# Patient Record
Sex: Male | Born: 1985 | Race: White | Hispanic: No | Marital: Single | Smoking: Current every day smoker
Health system: Southern US, Community
[De-identification: ages and names within clinical notes are randomized; demographics above are authoritative.]

## PROBLEM LIST (undated history)

## (undated) DIAGNOSIS — R011 Cardiac murmur, unspecified: Secondary | ICD-10-CM

## (undated) DIAGNOSIS — IMO0002 Reserved for concepts with insufficient information to code with codable children: Secondary | ICD-10-CM

## (undated) DIAGNOSIS — I341 Nonrheumatic mitral (valve) prolapse: Secondary | ICD-10-CM

## (undated) HISTORY — PX: DENTAL SURGERY: SHX609

## (undated) HISTORY — PX: CHOLECYSTECTOMY: SHX55

---
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
## 2008-12-30 ENCOUNTER — Emergency Department (HOSPITAL_BASED_OUTPATIENT_CLINIC_OR_DEPARTMENT_OTHER): Admission: EM | Admit: 2008-12-30 | Discharge: 2008-12-30 | Payer: Self-pay | Admitting: Emergency Medicine

## 2008-12-30 ENCOUNTER — Ambulatory Visit: Payer: Self-pay | Admitting: Diagnostic Radiology

## 2009-01-07 ENCOUNTER — Ambulatory Visit: Payer: Self-pay | Admitting: Diagnostic Radiology

## 2009-01-07 ENCOUNTER — Emergency Department (HOSPITAL_BASED_OUTPATIENT_CLINIC_OR_DEPARTMENT_OTHER): Admission: EM | Admit: 2009-01-07 | Discharge: 2009-01-07 | Payer: Self-pay | Admitting: Emergency Medicine

## 2009-03-09 ENCOUNTER — Emergency Department (HOSPITAL_BASED_OUTPATIENT_CLINIC_OR_DEPARTMENT_OTHER): Admission: EM | Admit: 2009-03-09 | Discharge: 2009-03-09 | Payer: Self-pay | Admitting: Emergency Medicine

## 2009-04-11 ENCOUNTER — Emergency Department (HOSPITAL_BASED_OUTPATIENT_CLINIC_OR_DEPARTMENT_OTHER): Admission: EM | Admit: 2009-04-11 | Discharge: 2009-04-11 | Payer: Self-pay | Admitting: Emergency Medicine

## 2009-04-28 ENCOUNTER — Emergency Department (HOSPITAL_BASED_OUTPATIENT_CLINIC_OR_DEPARTMENT_OTHER): Admission: EM | Admit: 2009-04-28 | Discharge: 2009-04-28 | Payer: Self-pay | Admitting: Emergency Medicine

## 2009-04-28 ENCOUNTER — Ambulatory Visit: Payer: Self-pay | Admitting: Diagnostic Radiology

## 2009-04-30 ENCOUNTER — Emergency Department (HOSPITAL_BASED_OUTPATIENT_CLINIC_OR_DEPARTMENT_OTHER): Admission: EM | Admit: 2009-04-30 | Discharge: 2009-04-30 | Payer: Self-pay | Admitting: Emergency Medicine

## 2009-05-08 ENCOUNTER — Emergency Department (HOSPITAL_BASED_OUTPATIENT_CLINIC_OR_DEPARTMENT_OTHER): Admission: EM | Admit: 2009-05-08 | Discharge: 2009-05-08 | Payer: Self-pay | Admitting: Emergency Medicine

## 2009-05-10 ENCOUNTER — Emergency Department (HOSPITAL_BASED_OUTPATIENT_CLINIC_OR_DEPARTMENT_OTHER): Admission: EM | Admit: 2009-05-10 | Discharge: 2009-05-10 | Payer: Self-pay | Admitting: Emergency Medicine

## 2009-05-12 ENCOUNTER — Emergency Department (HOSPITAL_BASED_OUTPATIENT_CLINIC_OR_DEPARTMENT_OTHER): Admission: EM | Admit: 2009-05-12 | Discharge: 2009-05-12 | Payer: Self-pay | Admitting: Emergency Medicine

## 2009-05-22 ENCOUNTER — Emergency Department (HOSPITAL_BASED_OUTPATIENT_CLINIC_OR_DEPARTMENT_OTHER): Admission: EM | Admit: 2009-05-22 | Discharge: 2009-05-22 | Payer: Self-pay | Admitting: Emergency Medicine

## 2009-06-28 ENCOUNTER — Emergency Department (HOSPITAL_BASED_OUTPATIENT_CLINIC_OR_DEPARTMENT_OTHER): Admission: EM | Admit: 2009-06-28 | Discharge: 2009-06-29 | Payer: Self-pay | Admitting: Emergency Medicine

## 2009-06-28 ENCOUNTER — Ambulatory Visit: Payer: Self-pay | Admitting: Diagnostic Radiology

## 2009-07-14 ENCOUNTER — Emergency Department (HOSPITAL_BASED_OUTPATIENT_CLINIC_OR_DEPARTMENT_OTHER): Admission: EM | Admit: 2009-07-14 | Discharge: 2009-07-14 | Payer: Self-pay | Admitting: Emergency Medicine

## 2009-07-24 ENCOUNTER — Emergency Department (HOSPITAL_BASED_OUTPATIENT_CLINIC_OR_DEPARTMENT_OTHER): Admission: EM | Admit: 2009-07-24 | Discharge: 2009-07-24 | Payer: Self-pay | Admitting: Emergency Medicine

## 2009-07-24 ENCOUNTER — Ambulatory Visit: Payer: Self-pay | Admitting: Diagnostic Radiology

## 2009-09-22 ENCOUNTER — Ambulatory Visit: Payer: Self-pay | Admitting: Diagnostic Radiology

## 2009-09-23 ENCOUNTER — Emergency Department (HOSPITAL_BASED_OUTPATIENT_CLINIC_OR_DEPARTMENT_OTHER): Admission: EM | Admit: 2009-09-23 | Discharge: 2009-09-23 | Payer: Self-pay | Admitting: Emergency Medicine

## 2010-08-08 IMAGING — CR DG FOOT COMPLETE 3+V*L*
3 series · 3 of 3 positions shown · non-contrast
Comparison: Ankle films from 12/30/2008

CLINICAL DATA: Fall and left leg injury.

LEFT FOOT - COMPLETE 3+ VIEW

[t foot ap left]
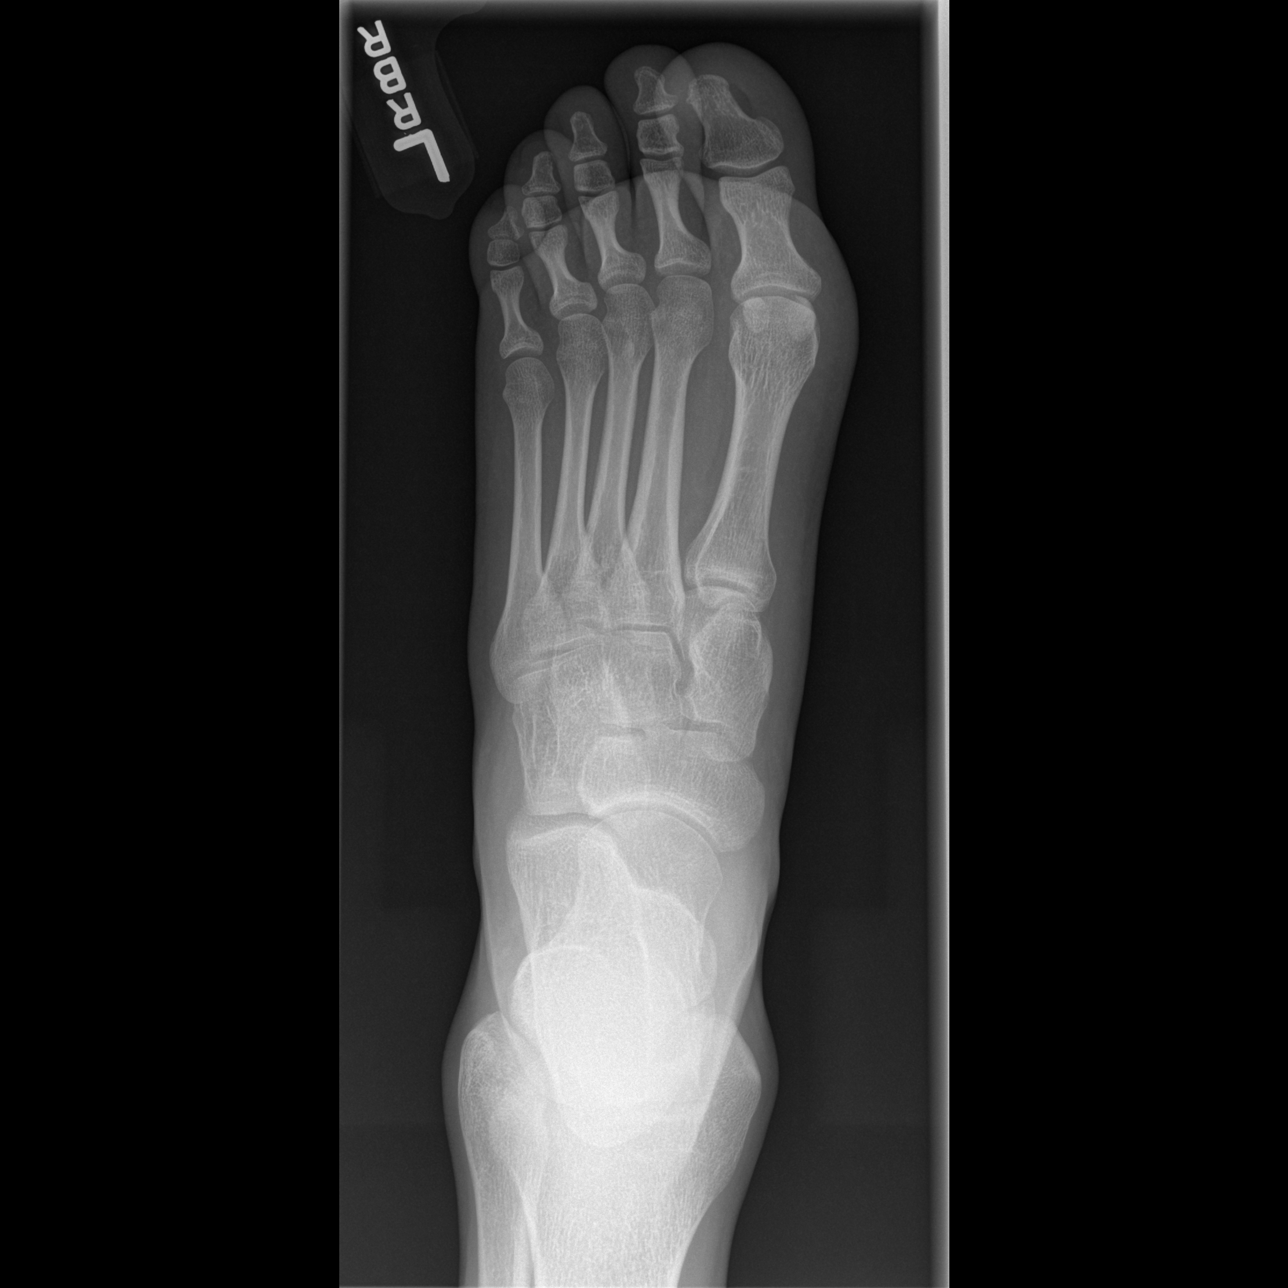

[t foot oblique left]
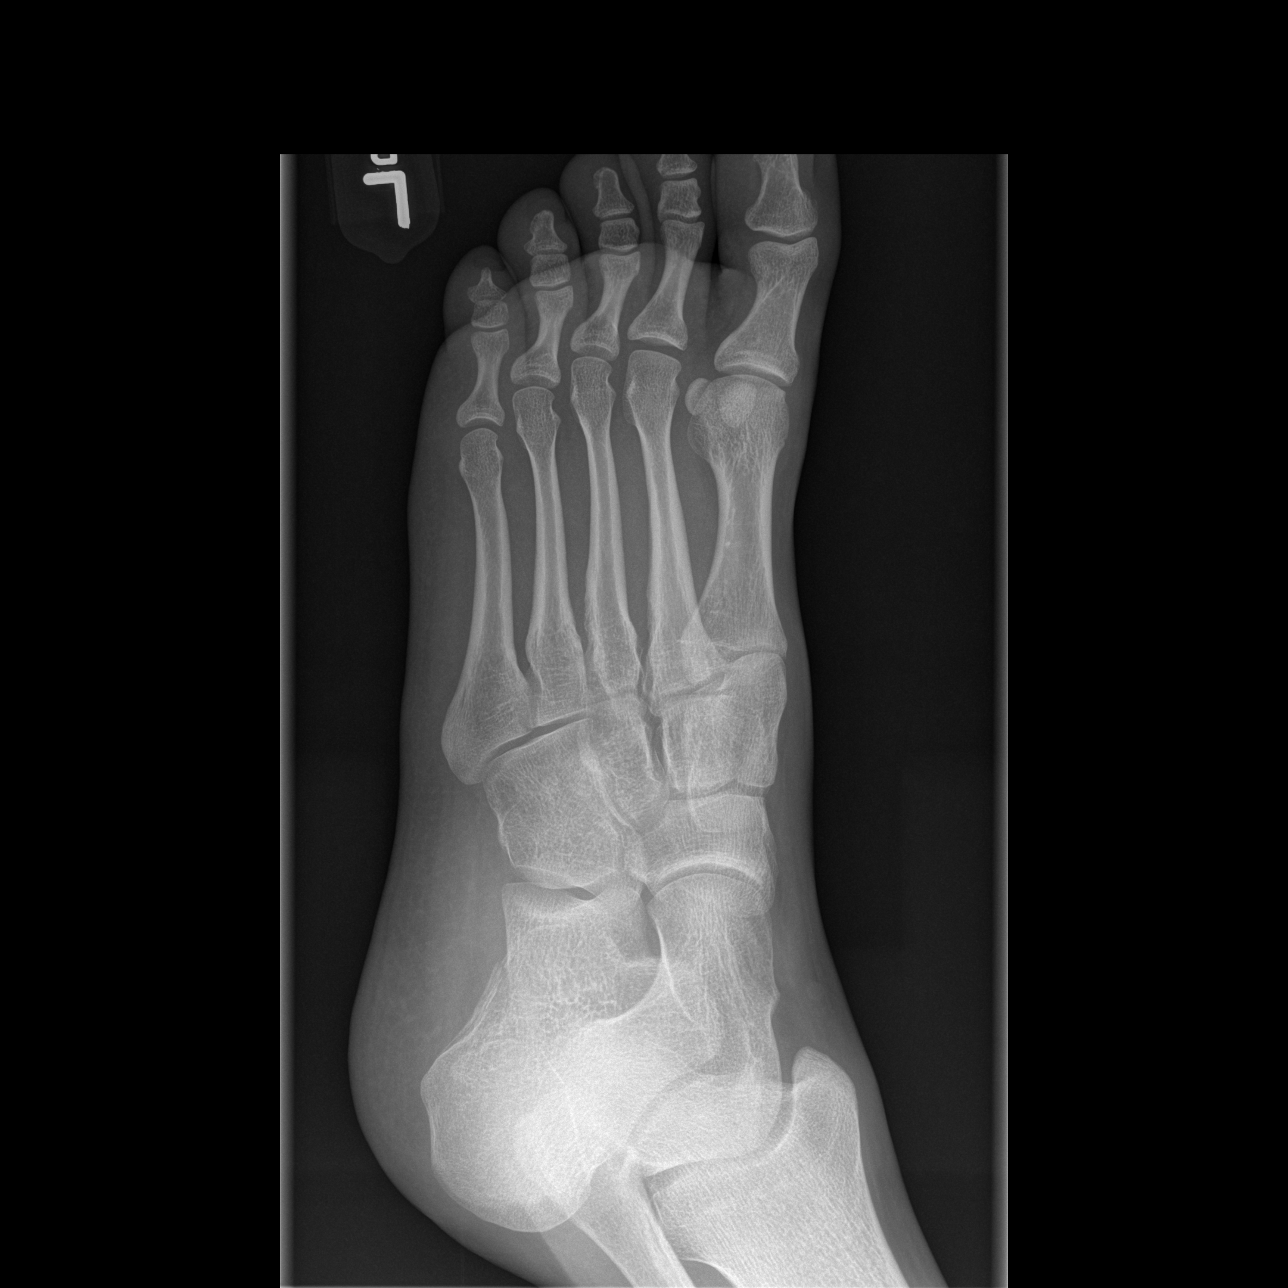

[t foot lat left]
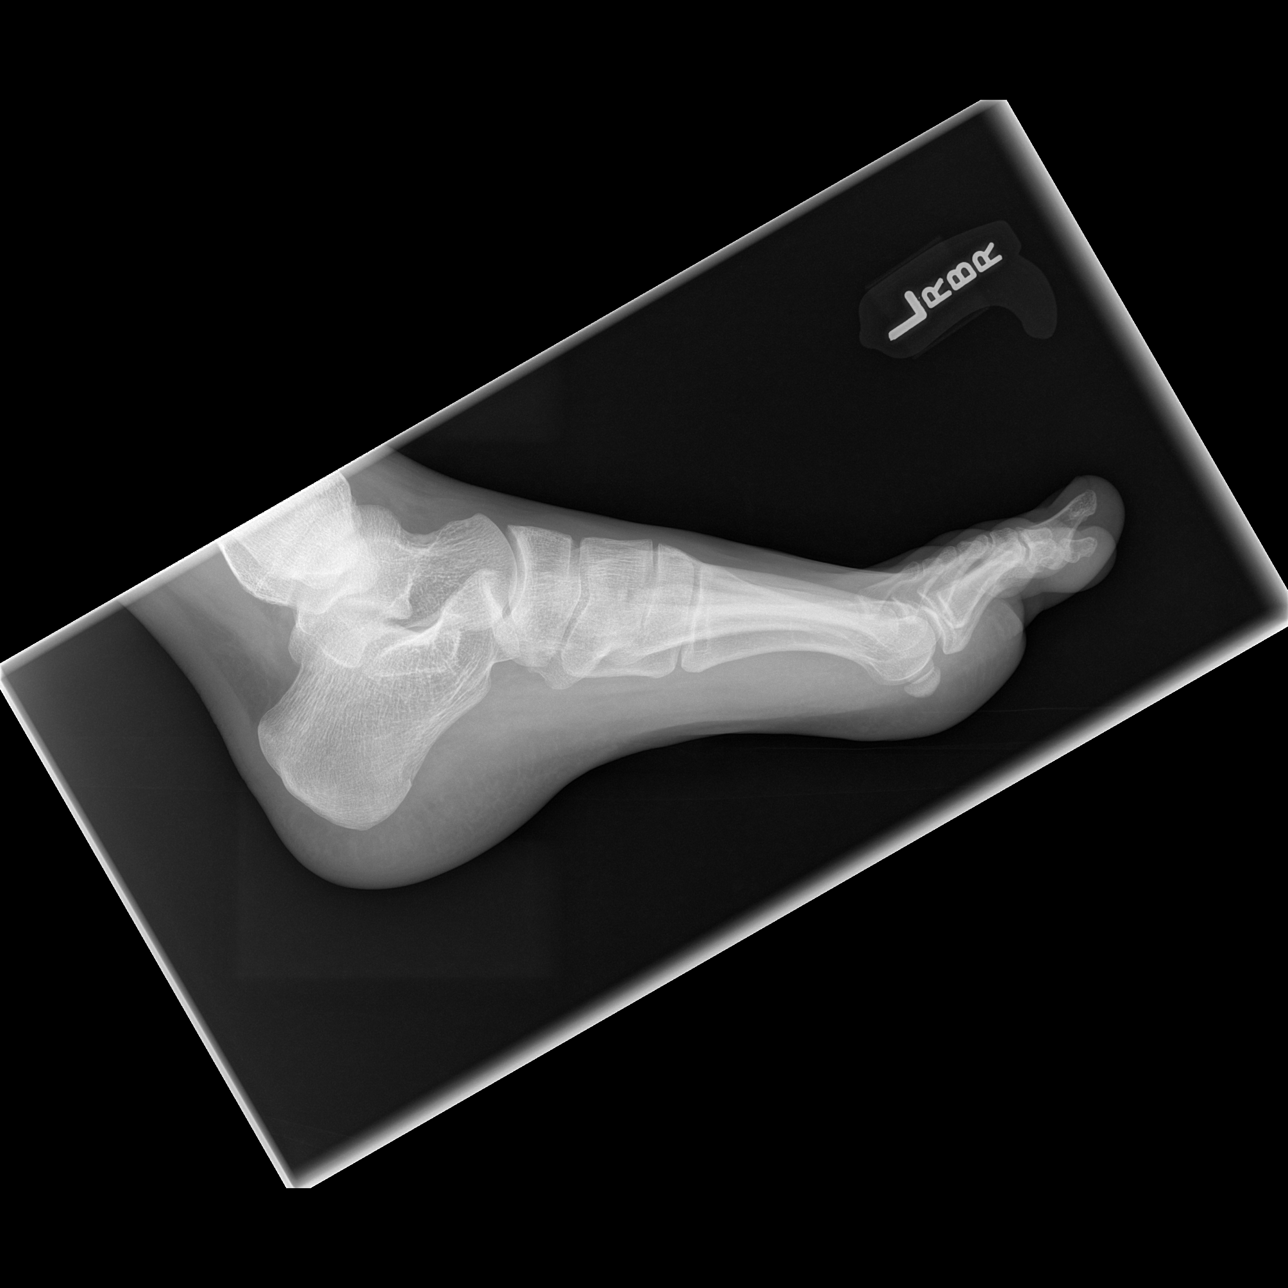

[3 of 3 positions shown; findings below may reference images not displayed]

FINDINGS: Three views of left foot demonstrate normal alignment.
No evidence for acute fracture or dislocation.  There are no gross
soft tissue abnormalities. The ankle films demonstrated a small of
bone fragment along the dorsal aspect of the navicular bone near
the talonavicular articulation.  This may represent an avulsion
injury of unknown age.
IMPRESSION: No acute bony abnormalities to the left foot.

## 2010-09-21 ENCOUNTER — Emergency Department (HOSPITAL_BASED_OUTPATIENT_CLINIC_OR_DEPARTMENT_OTHER)
Admission: EM | Admit: 2010-09-21 | Discharge: 2010-09-21 | Disposition: A | Discharge status: Home / Self Care | Payer: Self-pay | Attending: Emergency Medicine | Admitting: Emergency Medicine

## 2010-09-21 DIAGNOSIS — G8929 Other chronic pain: Secondary | ICD-10-CM | POA: Insufficient documentation

## 2010-09-21 DIAGNOSIS — X58XXXA Exposure to other specified factors, initial encounter: Secondary | ICD-10-CM | POA: Insufficient documentation

## 2010-09-21 DIAGNOSIS — F17200 Nicotine dependence, unspecified, uncomplicated: Secondary | ICD-10-CM | POA: Insufficient documentation

## 2010-09-21 DIAGNOSIS — S335XXA Sprain of ligaments of lumbar spine, initial encounter: Secondary | ICD-10-CM | POA: Insufficient documentation

## 2010-09-21 LAB — URINALYSIS, ROUTINE W REFLEX MICROSCOPIC
Glucose, UA: NEGATIVE mg/dL
Hgb urine dipstick: NEGATIVE
Ketones, ur: NEGATIVE mg/dL
Protein, ur: NEGATIVE mg/dL
Specific Gravity, Urine: 1.02 (ref 1.005–1.030)

## 2010-09-21 LAB — URINE CULTURE

## 2010-09-21 LAB — URINE MICROSCOPIC-ADD ON

## 2010-09-30 LAB — CULTURE, ROUTINE-ABSCESS: Gram Stain: NONE SEEN

## 2010-11-13 NOTE — Discharge Summary (Signed)
[facility]  Patient:    Justin Morgan, Justin Morgan Visit Number: [id number] MRN: [id number]          Service Type: PSY Location: 200 0201 02 Attending Physician:  Veneta Penton. Dictated by:   Veneta Penton, M.D. Admit Date:  10/13/2001 Disc. Date: 10/17/01                             Discharge Summary  REASON FOR ADMISSION:  This 25 year old white male was admitted complaining of a history of bipolar disorder with depression.  He was status post overdose with Depakote and Risperdal as a suicide attempt.  For further history of present illness, please see the patients psychiatric admission assessment.  PHYSICAL EXAMINATION:  At the time of admission was entirely unremarkable.  LABORATORY EXAMINATION:  The patient underwent a laboratory work-up to rule out any medical problems contributing to his symptomatology.  A hepatic panel was within normal limits.  A urine drug screen was positive for metabolites of lorazepam and was otherwise unremarkable.  A UA was unremarkable.  Basic metabolic panel was within normal limits.  Valproic acid level was subtherapeutic as the patient had not been taking his Depakote for several days.  A urine probe for gonorrhea and chlamydia were negative.  CBC showed an MCV of 92.4, MCHC of 34.7 and was otherwise unremarkable.  An RPR was nonreactive.  TSH and free T4 were within normal limits.  A GGT was within normal limits.  The patient received no x-rays, no special procedures, no additional consultations.  He sustained no complications during the course of this hospitalization.  HOSPITAL COURSE:  On admission, the patient was psychomotor agitated, easily distracted by extraneous stimuli, showed decreased concentration and attention span.  His affect and mood were depressed, irritable, and angry.  He displayed poor impulse control.  His concentration was decreased.  He was continued on Wellbutrin and titrated up to a  therapeutic dose.  He was continued on Risperdal and Depakote.  A valproic acid level, CBC and hepatic panel taken at steady state are pending at the time of discharge.  At the time of discharge, the patient is actively participating in all aspects of the therapeutic treatment program, denies any homicidal or suicidal ideation, is motivated for outpatient therapy, no longer appears to be a danger to himself or others, and consequently is felt to have reached his maximum benefits of hospitalization and is ready for discharge to a less restricted alternative setting.  CONDITION ON DISCHARGE:  Improved  FINAL DIAGNOSIS: Axis I:    1. Bipolar disorder, depressed type, severe, without psychosis.            2. Rule out major depression.            3. Rule out attention deficit hyperactivity disorder.            4. Conduct disorder.            5. Nicotine dependence.            6. Rule out polysubstance dependence. Axis II:   1. Rule out personality disorder not otherwise specified.            2. Antisocial traits. Axis III:  None. Axis IV:   Severe. Axis V:    Code 20 on admission, code 30 on discharge.  FURTHER EVALUATION AND TREATMENT RECOMMENDATIONS: 1. The patient is discharged to home. 2. He is discharged on an  unrestricted level of activity and a regular diet. 3. He will follow up with Dr. Dub Mikes, his outpatient psychiatrist, for all    further aspects of his psychiatric care and consequently I will sign off    on the case at this time.  He will need to have his valproic acid level,    CBC and hepatic panel levels checked on follow up with Dr. Dub Mikes. 4. He will follow up with his primary care physician for all further aspects    of his medical care. Dictated by:   Veneta Penton, M.D. Attending Physician:  Veneta Penton DD:  10/17/01 TD:  10/17/01 Job: [id number]
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
## 2011-05-02 IMAGING — CR DG ABDOMEN ACUTE W/ 1V CHEST
4 series · 4 of 4 positions shown · non-contrast
Comparison: None

CLINICAL DATA: Abdominal pain

ACUTE ABDOMEN SERIES (ABDOMEN 2 VIEW & CHEST 1 VIEW)

[w chest pa]
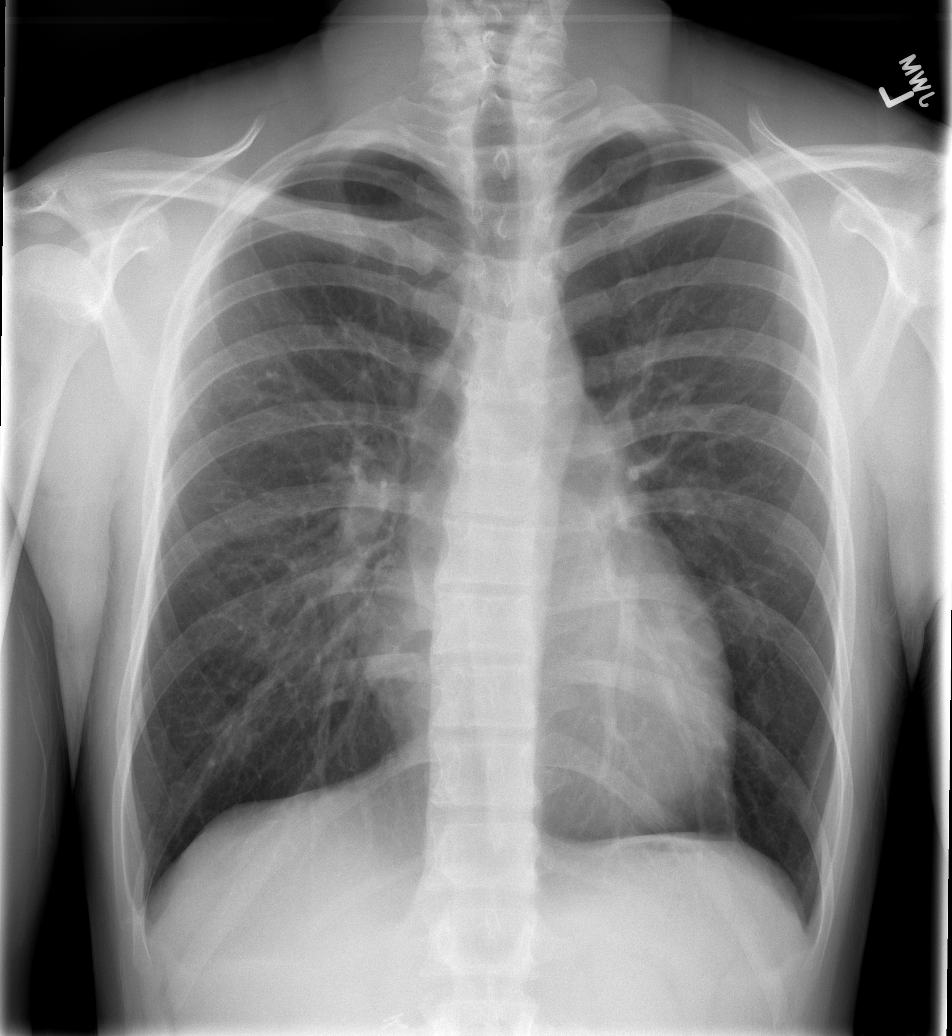

[w abdomen upright]
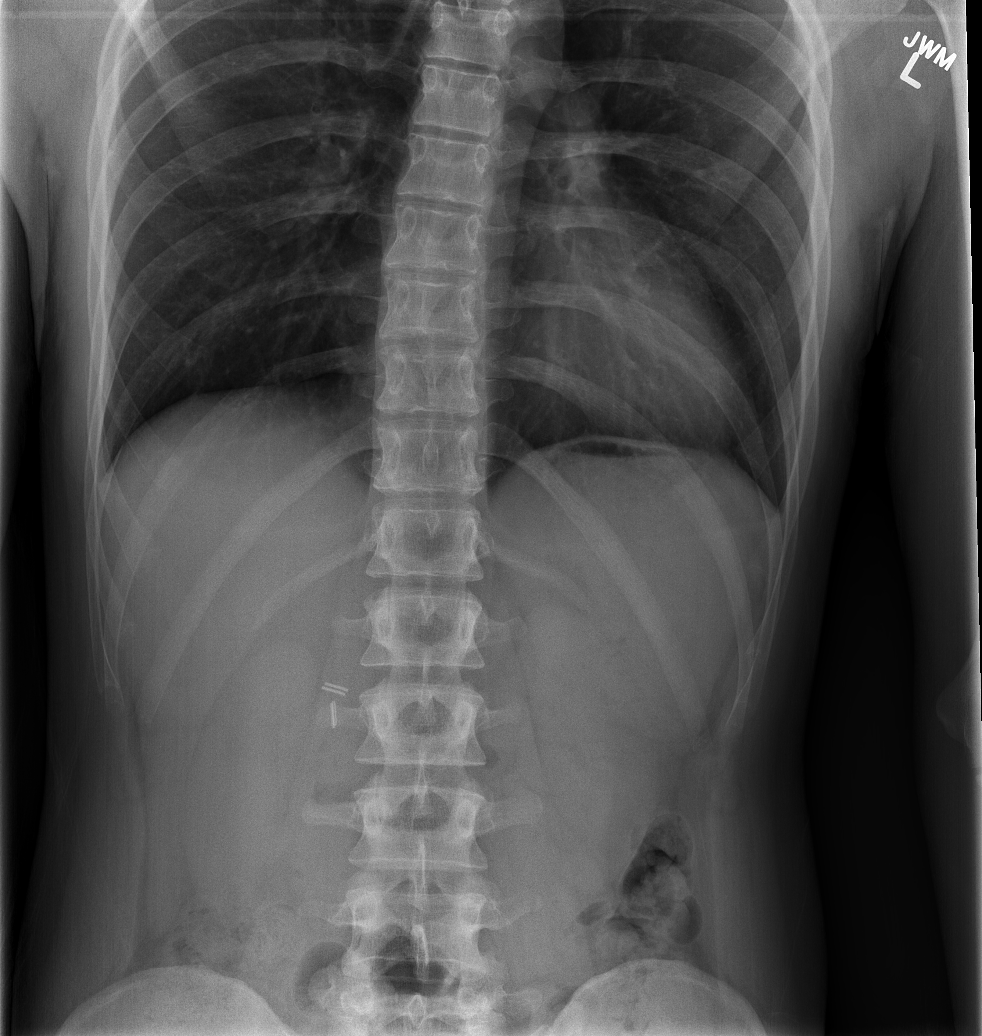

[t abdomen supine (1 of 2)]
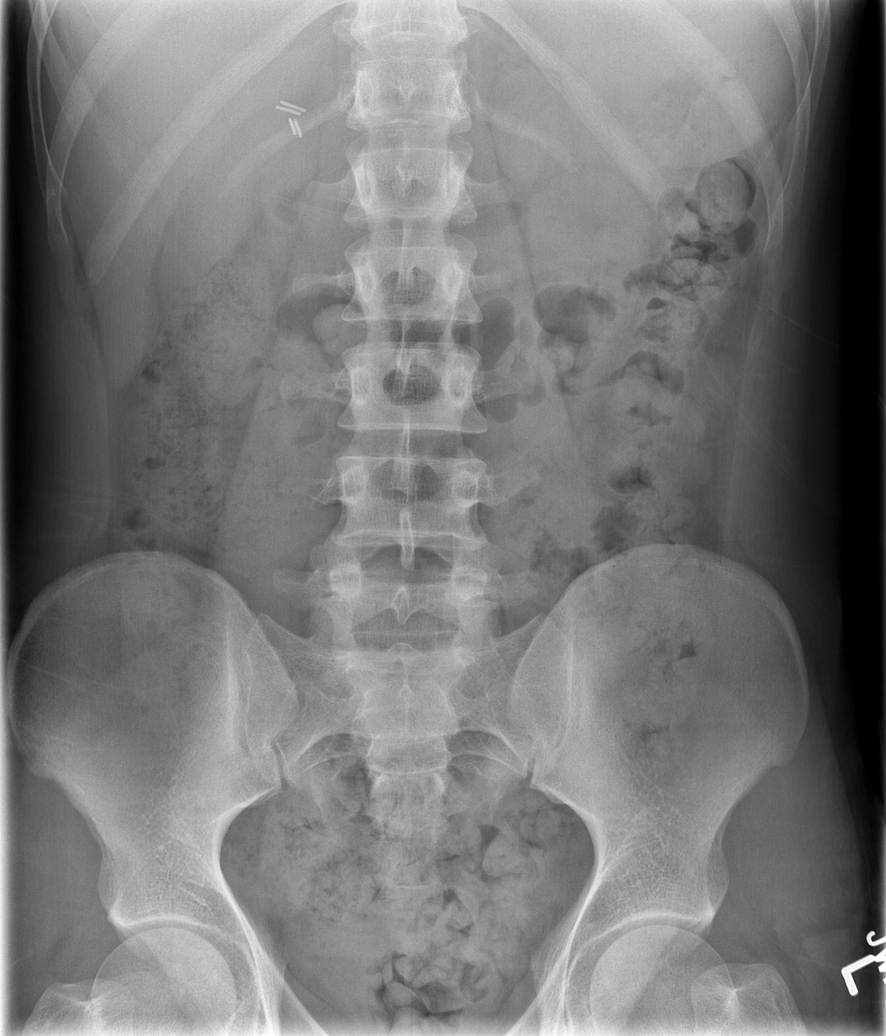

[t abdomen supine (2 of 2)]
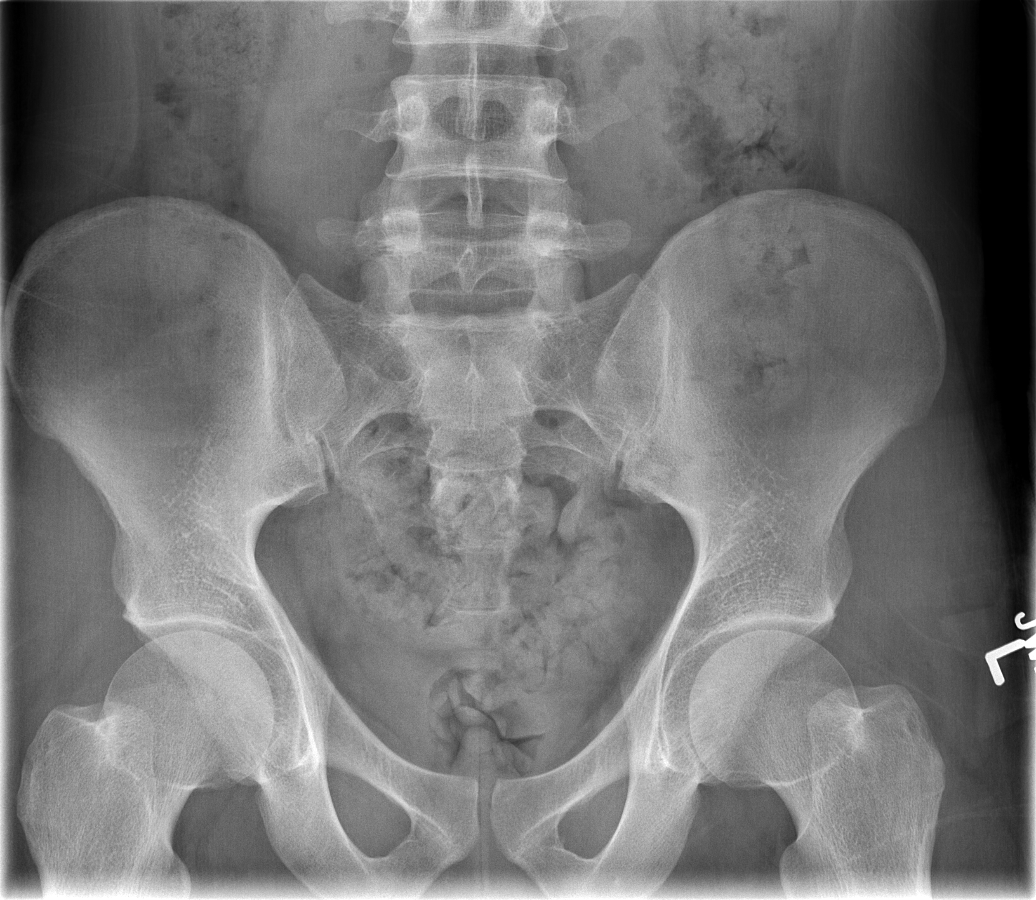

[4 of 4 positions shown; findings below may reference images not displayed]

FINDINGS: Surgical clips are present in the right upper quadrant,
presumably following cholecystectomy.  Bowel gas pattern is normal
without evidence of ileus, obstruction or free air.  There is a
moderate amount of stool.  No abnormal calcifications or bony
findings.

One-view chest shows normal heart and mediastinal shadows.  The
lungs are clear.  No effusions.  No free air.
IMPRESSION: Previous cholecystectomy.  No acute chest or abdominal finding.
Fairly large amount of stool the colon.

## 2012-04-02 ENCOUNTER — Encounter (HOSPITAL_BASED_OUTPATIENT_CLINIC_OR_DEPARTMENT_OTHER): Payer: Self-pay | Admitting: *Deleted

## 2012-04-02 ENCOUNTER — Emergency Department (HOSPITAL_BASED_OUTPATIENT_CLINIC_OR_DEPARTMENT_OTHER)
Admission: EM | Admit: 2012-04-02 | Discharge: 2012-04-02 | Disposition: A | Discharge status: Home / Self Care | Payer: Self-pay | Attending: Emergency Medicine | Admitting: Emergency Medicine

## 2012-04-02 DIAGNOSIS — F17200 Nicotine dependence, unspecified, uncomplicated: Secondary | ICD-10-CM | POA: Insufficient documentation

## 2012-04-02 DIAGNOSIS — I059 Rheumatic mitral valve disease, unspecified: Secondary | ICD-10-CM | POA: Insufficient documentation

## 2012-04-02 DIAGNOSIS — M542 Cervicalgia: Secondary | ICD-10-CM | POA: Insufficient documentation

## 2012-04-02 HISTORY — DX: Reserved for concepts with insufficient information to code with codable children: IMO0002

## 2012-04-02 HISTORY — DX: Cardiac murmur, unspecified: R01.1

## 2012-04-02 HISTORY — DX: Nonrheumatic mitral (valve) prolapse: I34.1

## 2012-04-02 MED ORDER — CYCLOBENZAPRINE HCL 10 MG PO TABS: 10.0000 mg | ORAL_TABLET | Freq: Two times a day (BID) | ORAL | Status: DC | PRN

## 2012-04-02 MED ORDER — HYDROCODONE-ACETAMINOPHEN 5-325 MG PO TABS: 2.0000 | ORAL_TABLET | ORAL | Status: DC | PRN

## 2012-04-02 MED ORDER — PREDNISONE 10 MG PO TABS: ORAL_TABLET | ORAL | Status: DC

## 2012-04-02 NOTE — ED Provider Notes (Signed)
History     CSN: [id number]  Arrival date & time 04/02/12  1845   First MD Initiated Contact with Patient 04/02/12 2140      Chief Complaint  Patient presents with  . Neck Pain    (Consider location/radiation/quality/duration/timing/severity/associated sxs/prior treatment) Patient is a 26 y.o. male presenting with neck injury. The history is provided by the patient. No language interpreter was used.  Neck Injury This is a new problem. The current episode started today. The problem occurs constantly. The problem has been gradually worsening. Nothing aggravates the symptoms. He has tried nothing for the symptoms. The treatment provided no relief.   Pt reports he has a history of neck problems.   Pt reports he was doing abdominal crunches and he is now having pain Past Medical History  Diagnosis Date  . Heart murmur   . Herniated disc   . MVP (mitral valve prolapse)     Past Surgical History  Procedure Date  . Cholecystectomy     History reviewed. No pertinent family history.  History  Substance Use Topics  . Smoking status: Current Every Day Smoker  . Smokeless tobacco: Not on file  . Alcohol Use: Yes      Review of Systems  All other systems reviewed and are negative.    Allergies  Review of patient's allergies indicates no known allergies.  Home Medications   Current Outpatient Rx  Name Route Sig Dispense Refill  . CYCLOBENZAPRINE HCL 10 MG PO TABS Oral Take 1 tablet (10 mg total) by mouth 2 (two) times daily as needed for muscle spasms. 20 tablet 0  . HYDROCODONE-ACETAMINOPHEN 5-325 MG PO TABS Oral Take 2 tablets by mouth every 4 (four) hours as needed for pain. 16 tablet 0  . PREDNISONE 10 MG PO TABS  6,5,4,3,2,1 taper 21 tablet 0    BP 132/74  Pulse 84  Temp 98.2 F (36.8 C) (Oral)  Resp 18  Ht 5\' 11"  (1.803 m)  Wt 175 lb (79.379 kg)  BMI 24.41 kg/m2  SpO2 98%  Physical Exam  Nursing note and vitals reviewed. Constitutional: He is oriented to  person, place, and time. He appears well-developed and well-nourished.  HENT:  Head: Normocephalic and atraumatic.  Eyes: Conjunctivae normal and EOM are normal. Pupils are equal, round, and reactive to light.  Neck:       Tender cervical spine,  Decreased range of motion,  nv and ns intact  Cardiovascular: Normal rate.   Pulmonary/Chest: Effort normal.  Musculoskeletal: Normal range of motion.  Neurological: He is alert and oriented to person, place, and time. He has normal reflexes.  Skin: Skin is warm.    ED Course  Procedures (including critical care time)  Labs Reviewed - No data to display No results found.   1. Neck pain       MDM  Prednisone, flexeril and hydrocodone for pain.   Pt referred to Dr. Pearletha Forge for follow up        Elson Areas, PA 04/02/12 2159  Lonia Skinner [place], [place] 04/02/12 2203

## 2012-04-02 NOTE — ED Notes (Signed)
PA at bedside.

## 2012-04-02 NOTE — ED Notes (Signed)
Pt states he has a hx of ruptured disc and was lifting wts about 6 weeks ago and reinjured it. Was off activity for 6 weeks and was doing crunches again 2 days ago and ? reinjured.

## 2012-04-02 NOTE — ED Provider Notes (Signed)
Medical screening examination/treatment/procedure(s) were performed by non-physician practitioner and as supervising physician I was immediately available for consultation/collaboration.   Dione Booze, MD 04/02/12 2329

## 2012-04-05 ENCOUNTER — Ambulatory Visit (INDEPENDENT_AMBULATORY_CARE_PROVIDER_SITE_OTHER): Payer: Self-pay | Admitting: Family Medicine

## 2012-04-05 ENCOUNTER — Encounter: Payer: Self-pay | Admitting: Family Medicine

## 2012-04-05 VITALS — BP 127/90 | HR 78 | Ht 71.0 in | Wt 175.0 lb

## 2012-04-05 DIAGNOSIS — M542 Cervicalgia: Secondary | ICD-10-CM

## 2012-04-05 DIAGNOSIS — G8929 Other chronic pain: Secondary | ICD-10-CM

## 2012-04-05 MED ORDER — MELOXICAM 15 MG PO TABS: 15.0000 mg | ORAL_TABLET | Freq: Every day | ORAL | Status: DC

## 2012-04-05 MED ORDER — CYCLOBENZAPRINE HCL 10 MG PO TABS: 10.0000 mg | ORAL_TABLET | Freq: Three times a day (TID) | ORAL | Status: DC | PRN

## 2012-04-05 MED ORDER — HYDROCODONE-ACETAMINOPHEN 5-325 MG PO TABS: 2.0000 | ORAL_TABLET | Freq: Four times a day (QID) | ORAL | Status: AC | PRN

## 2012-04-05 NOTE — Patient Instructions (Addendum)
You have chronic neck pain (usually related to a combination of disc issues, muscle spasms). Treatment for these conditions is similar at the beginning. Finish your prednisone then day after finishing this start meloxicam 15mg  daily with food. Flexeril three times a day as needed for muscle spasms (can make you sleepy - if so do not drive while taking this). Norco as needed for severe pain (no driving on this medicine) - we do not refill this. Consider cervical collar if severely painful. Simple range of motion exercises within limits of pain to prevent further stiffness. Consider physical therapy for stretching, exercises, traction, and modalities. Call us when the [facility] coverage goes through and we can discuss repeating imaging, physical therapy, possible injections (based on your MRI). Heat 15 minutes at a time 3-4 times a day to help with spasms. Watch head position when on computers, texting, when sleeping in bed - should in line with back.

## 2012-04-07 ENCOUNTER — Encounter: Payer: Self-pay | Admitting: Family Medicine

## 2012-04-07 DIAGNOSIS — G8929 Other chronic pain: Secondary | ICD-10-CM | POA: Insufficient documentation

## 2012-04-07 NOTE — Assessment & Plan Note (Signed)
Acute on chronic neck pain - ROI filled out to obtain prior MRI reports.  Based on history sounds like he had an extensive workup and treatment without much benefit.  Would consider repeating imaging based on when this workup was done.  Given information to apply for [facility] Coverage so we could consider this, physical therapy.  Prednisone dose pack with transition to meloxicam.  Norco as needed for severe pain (q6h #60) - narcotics database reviewed from past year - not getting pain medication from multiple providers.  Flexeril as needed for spasms.  To call us when [facility] coverage goes through to discuss next steps (PT, imaging).

## 2012-04-07 NOTE — Progress Notes (Addendum)
Subjective:    Patient ID: Justin Morgan, male    DOB: 11-09-1985, 26 y.o.   MRN: [id number]  PCP: Dr. Leonette Most  HPI 26 yo M here for neck pain.  Patient reports about 5-6 years ago he had neck pain, was being seen at [facility] and [facility]. Reports he's had MRI, x-rays before. Did formal physical therapy, tried several different medications. Saw neurosurgeon(s) who recommended against surgical intervention based on MRI findings. Eventually recommended he go to a pain clinic. Overall never completely improved then about 6 weeks ago while lifting weights he thinks he irritated neck while doing shrugs then abruptly turning head to left side. Flared again on Friday while doing crunches with neck flexion. States pain is between shoulder blades and in neck, radiates to right side of upper back/shoulder area. No numbness/tingling. No bowel/bladder dysfunction.  Past Medical History  Diagnosis Date  . Heart murmur   . Herniated disc   . MVP (mitral valve prolapse)     Current Outpatient Prescriptions on File Prior to Visit  Medication Sig Dispense Refill  . predniSONE (DELTASONE) 10 MG tablet 6,5,4,3,2,1 taper  21 tablet  0    Past Surgical History  Procedure Date  . Cholecystectomy     No Known Allergies  History   Social History  . Marital Status: Single    Spouse Name: N/A    Number of Children: N/A  . Years of Education: N/A   Occupational History  . Not on file.   Social History Main Topics  . Smoking status: Current Every Day Smoker -- 1.0 packs/day    Types: Cigarettes  . Smokeless tobacco: Not on file  . Alcohol Use: Yes  . Drug Use: No  . Sexually Active: Not on file   Other Topics Concern  . Not on file   Social History Narrative  . No narrative on file    Family History  Problem Relation Age of Onset  . Hypertension Father   . Diabetes Neg Hx   . Heart attack Neg Hx   . Hyperlipidemia Neg Hx   . Sudden death Neg Hx     BP  127/90  Pulse 78  Ht 5\' 11"  (1.803 m)  Wt 175 lb (79.379 kg)  BMI 24.41 kg/m2  Review of Systems See HPI above.    Objective:   Physical Exam Gen: NAD  Neck: No gross deformity, swelling, bruising.  TTP upper thoracic and cervical paraspinal regions R > L.  No midline/bony TTP. FROM neck - pain with extension, left lateral rotation. BUE strength 5/5.   Sensation intact to light touch.   1+ equal reflexes in triceps, biceps, brachioradialis tendons. Negative spurlings. NV intact distal BUEs.    Assessment & Plan:  1. Acute on chronic neck pain - ROI filled out to obtain prior MRI reports.  Based on history sounds like he had an extensive workup and treatment without much benefit.  Would consider repeating imaging based on when this workup was done.  Given information to apply for [facility] Coverage so we could consider this, physical therapy.  Prednisone dose pack with transition to meloxicam.  Norco as needed for severe pain (q6h #60) - narcotics database reviewed from past year - not getting pain medication from multiple providers.  Flexeril as needed for spasms.  To call us when [facility] coverage goes through to discuss next steps (PT, imaging).  Addendum 10/21:  Patient called requesting refill on norco.  Denied this - he should not  be out of this already and it's not appropriate long-term treatment.  We advised when we gave this to him it would be a one time script.  He wants to go ahead with MRI so order placed and will review findings and how to proceed.  Prior records not available - place we requested ROI from stated they had nothing on this patient.  Addendum 10/23:  Discussed MRI results with patient.  At two levels he has some disc extrusion, most pronounced at C6-7 level with some flattening of spinal cord, migration of disc material superior and inferiorly.  Discussed options - has done ESIs, PT in past - are considerations but doubt these would be of much benefit given he's tried  them extensively before.  Will try to get him in with a neurosurgery group to discuss possible surgical intervention and pain management referral for other modalities, medications to help with pain.

## 2012-04-18 ENCOUNTER — Ambulatory Visit (HOSPITAL_BASED_OUTPATIENT_CLINIC_OR_DEPARTMENT_OTHER)
Admission: RE | Admit: 2012-04-18 | Discharge: 2012-04-18 | Disposition: A | Discharge status: Home / Self Care | Payer: Self-pay | Source: Ambulatory Visit | Attending: Family Medicine | Admitting: Family Medicine

## 2012-04-18 DIAGNOSIS — M25519 Pain in unspecified shoulder: Secondary | ICD-10-CM | POA: Insufficient documentation

## 2012-04-18 DIAGNOSIS — M5020 Other cervical disc displacement, unspecified cervical region: Secondary | ICD-10-CM | POA: Insufficient documentation

## 2012-04-18 DIAGNOSIS — M4802 Spinal stenosis, cervical region: Secondary | ICD-10-CM | POA: Insufficient documentation

## 2012-04-18 DIAGNOSIS — M542 Cervicalgia: Secondary | ICD-10-CM

## 2012-04-18 DIAGNOSIS — G8929 Other chronic pain: Secondary | ICD-10-CM

## 2012-04-18 DIAGNOSIS — M25619 Stiffness of unspecified shoulder, not elsewhere classified: Secondary | ICD-10-CM | POA: Insufficient documentation

## 2012-04-18 NOTE — Addendum Note (Signed)
Addended by: Lenda Kelp on: 04/18/2012 02:23 PM   Modules accepted: Orders

## 2012-04-19 NOTE — Addendum Note (Signed)
Addended by: Lenda Kelp on: 04/19/2012 04:27 PM   Modules accepted: Orders

## 2012-04-26 ENCOUNTER — Encounter: Payer: Self-pay | Admitting: Physical Medicine & Rehabilitation

## 2012-05-21 ENCOUNTER — Encounter (HOSPITAL_BASED_OUTPATIENT_CLINIC_OR_DEPARTMENT_OTHER): Payer: Self-pay | Admitting: *Deleted

## 2012-05-21 ENCOUNTER — Emergency Department (HOSPITAL_BASED_OUTPATIENT_CLINIC_OR_DEPARTMENT_OTHER)
Admission: EM | Admit: 2012-05-21 | Discharge: 2012-05-21 | Disposition: A | Discharge status: Home / Self Care | Payer: Self-pay | Attending: Emergency Medicine | Admitting: Emergency Medicine

## 2012-05-21 DIAGNOSIS — Z8679 Personal history of other diseases of the circulatory system: Secondary | ICD-10-CM | POA: Insufficient documentation

## 2012-05-21 DIAGNOSIS — R059 Cough, unspecified: Secondary | ICD-10-CM | POA: Insufficient documentation

## 2012-05-21 DIAGNOSIS — J40 Bronchitis, not specified as acute or chronic: Secondary | ICD-10-CM | POA: Insufficient documentation

## 2012-05-21 DIAGNOSIS — IMO0002 Reserved for concepts with insufficient information to code with codable children: Secondary | ICD-10-CM | POA: Insufficient documentation

## 2012-05-21 DIAGNOSIS — F17200 Nicotine dependence, unspecified, uncomplicated: Secondary | ICD-10-CM | POA: Insufficient documentation

## 2012-05-21 DIAGNOSIS — R079 Chest pain, unspecified: Secondary | ICD-10-CM | POA: Insufficient documentation

## 2012-05-21 DIAGNOSIS — J029 Acute pharyngitis, unspecified: Secondary | ICD-10-CM | POA: Insufficient documentation

## 2012-05-21 DIAGNOSIS — G8929 Other chronic pain: Secondary | ICD-10-CM | POA: Insufficient documentation

## 2012-05-21 DIAGNOSIS — Z791 Long term (current) use of non-steroidal anti-inflammatories (NSAID): Secondary | ICD-10-CM | POA: Insufficient documentation

## 2012-05-21 DIAGNOSIS — M542 Cervicalgia: Secondary | ICD-10-CM | POA: Insufficient documentation

## 2012-05-21 DIAGNOSIS — R05 Cough: Secondary | ICD-10-CM | POA: Insufficient documentation

## 2012-05-21 MED ORDER — AZITHROMYCIN 250 MG PO TABS: 250.0000 mg | ORAL_TABLET | Freq: Every day | ORAL | Status: DC

## 2012-05-21 MED ORDER — MUCINEX DM 30-600 MG PO TB12: 1.0000 | ORAL_TABLET | Freq: Two times a day (BID) | ORAL | Status: DC

## 2012-05-21 MED ORDER — NAPROXEN 500 MG PO TABS: 500.0000 mg | ORAL_TABLET | Freq: Two times a day (BID) | ORAL | Status: DC

## 2012-05-21 NOTE — ED Provider Notes (Signed)
History  This chart was scribed for Justin Jakes, MD by Bennett Scrape, ED Scribe. This patient was seen in room MH01/MH01 and the patient's care was started at 3:24 PM.  CSN: [id number]  Arrival date & time 05/21/12  1420   First MD Initiated Contact with Patient 05/21/12 1524      Chief Complaint  Patient presents with  . Sore Throat     Patient is a 26 y.o. male presenting with pharyngitis. The history is provided by the patient. No language interpreter was used.  Sore Throat The current episode started more than 2 days ago. The problem occurs constantly. The problem has been gradually worsening. Associated symptoms include chest pain (with coughing). Pertinent negatives include no abdominal pain and no headaches. The symptoms are aggravated by swallowing. Nothing relieves the symptoms.    [name] is a 26 y.o. male who presents to the Emergency Department complaining of 3 days of gradual onset, gradually worsening, constant sore throat with associated coughing, post-tussive emesis and chest pain with coughing. The sore throat is worse with swallowing. He states that he has been drinking tea and taking Alka seltzer for his symptoms with mild improvement. He reports prior episodes of strep throat in the past. He also c/o chronic neck pain but denies changes. He denies fever, chills, nausea, vomiting, diarrhea and abdominal pain as associated symptoms. He has a h/o MVP and is a current everyday smoker and occasional alcohol user.  Dr. Leonette Most with [facility] is PCP.  Past Medical History  Diagnosis Date  . Heart murmur   . Herniated disc   . MVP (mitral valve prolapse)     Past Surgical History  Procedure Date  . Cholecystectomy     Family History  Problem Relation Age of Onset  . Hypertension Father   . Diabetes Neg Hx   . Heart attack Neg Hx   . Hyperlipidemia Neg Hx   . Sudden death Neg Hx     History  Substance Use Topics  . Smoking status: Current  Every Day Smoker -- 1.0 packs/day    Types: Cigarettes  . Smokeless tobacco: Not on file  . Alcohol Use: Yes      Review of Systems  Constitutional: Negative for fever and chills.  HENT: Positive for sore throat and neck pain (chronic). Negative for ear pain and rhinorrhea.   Respiratory: Positive for cough.   Cardiovascular: Positive for chest pain (with coughing).  Gastrointestinal: Negative for nausea, vomiting, abdominal pain and diarrhea.  Genitourinary: Negative for dysuria.  Skin: Negative for rash.  Neurological: Negative for headaches.  Hematological: Does not bruise/bleed easily.  All other systems reviewed and are negative.    Allergies  Review of patient's allergies indicates no known allergies.  Home Medications   Current Outpatient Rx  Name  Route  Sig  Dispense  Refill  . ALKA-SELTZER PLS SINUS & COUGH PO   Oral   Take by mouth.         . AZITHROMYCIN 250 MG PO TABS   Oral   Take 1 tablet (250 mg total) by mouth daily. Take first 2 tablets together, then 1 every day until finished.   6 tablet   0   . CYCLOBENZAPRINE HCL 10 MG PO TABS   Oral   Take 1 tablet (10 mg total) by mouth 3 (three) times daily as needed for muscle spasms.   90 tablet   0   . MUCINEX DM 30-600 MG PO TB12  Oral   Take 1 tablet by mouth every 12 (twelve) hours.   14 each   0   . MELOXICAM 15 MG PO TABS   Oral   Take 1 tablet (15 mg total) by mouth daily. With food.   30 tablet   1   . NAPROXEN 500 MG PO TABS   Oral   Take 1 tablet (500 mg total) by mouth 2 (two) times daily.   14 tablet   0   . PREDNISONE 10 MG PO TABS      6,5,4,3,2,1 taper   21 tablet   0     Triage Vitals: BP 125/84  Pulse 98  Temp 98.1 F (36.7 C) (Oral)  SpO2 99%  Physical Exam  Nursing note and vitals reviewed. Constitutional: He is oriented to person, place, and time. He appears well-developed and well-nourished. No distress.  HENT:  Head: Normocephalic and atraumatic.    Mouth/Throat: Oropharynx is clear and moist.       Tonsillar enlargement bilaterally, no exudate, more erythema noted on the left, uvula is midline  Eyes: Conjunctivae normal and EOM are normal. Pupils are equal, round, and reactive to light.  Neck: Normal range of motion. Neck supple. No tracheal deviation present.  Cardiovascular: Normal rate and regular rhythm.   No murmur heard. Pulmonary/Chest: Effort normal and breath sounds normal. No respiratory distress.  Abdominal: Soft. Bowel sounds are normal. There is no tenderness.  Musculoskeletal: Normal range of motion.  Lymphadenopathy:    He has no cervical adenopathy.  Neurological: He is alert and oriented to person, place, and time. No cranial nerve deficit.       Pt is able to wiggle both sets of fingers and toes  Skin: Skin is warm and dry.  Psychiatric: He has a normal mood and affect. His behavior is normal.    ED Course  Procedures (including critical care time)  DIAGNOSTIC STUDIES: Oxygen Saturation is 99% on room air, normal by my interpretation.    COORDINATION OF CARE: 3:39 PM- Discussed treatment plan which includes CXR, rapid strep test and Zithromax with pt at bedside and pt decided on antibiotics and forgoing the CXR and rapid strep test. Advised pt to take Mucinex DM and Aleve and to return if symptoms don't improve with the full antibiotic course.   Labs Reviewed - No data to display No results found.   1. Sore throat   2. Bronchitis       MDM  Patient is nontoxic no acute distress. Has had a history of strep pharyngitis in the past also has a bronchitis or pneumonia picture. Patient opted to be treated with antibiotics that'll cover the strep and pneumonia did not want to go for chest x-ray and rapid strep test here in the ED. Patient is aware the Zithromax if that helps in the next one to 2 days most likely treated either strep throat or a pneumonia based on this if not is probably viral base. Have  recommended he use Mucinex DM and take Naprosyn for the chest discomfort with the cough and sore throat.     I personally performed the services described in this documentation, which was scribed in my presence. The recorded information has been reviewed and is accurate.    Justin Jakes, MD 05/21/12 [phone]

## 2012-05-21 NOTE — ED Notes (Signed)
Pt states sore throat with coughing x 2-3 days. Denies fever or chills. Denies abdominal symptoms.

## 2012-06-03 ENCOUNTER — Emergency Department (HOSPITAL_BASED_OUTPATIENT_CLINIC_OR_DEPARTMENT_OTHER)
Admission: EM | Admit: 2012-06-03 | Discharge: 2012-06-03 | Disposition: A | Discharge status: Home / Self Care | Payer: No Typology Code available for payment source | Attending: Emergency Medicine | Admitting: Emergency Medicine

## 2012-06-03 ENCOUNTER — Encounter (HOSPITAL_BASED_OUTPATIENT_CLINIC_OR_DEPARTMENT_OTHER): Payer: Self-pay | Admitting: *Deleted

## 2012-06-03 DIAGNOSIS — I059 Rheumatic mitral valve disease, unspecified: Secondary | ICD-10-CM | POA: Insufficient documentation

## 2012-06-03 DIAGNOSIS — F17200 Nicotine dependence, unspecified, uncomplicated: Secondary | ICD-10-CM | POA: Insufficient documentation

## 2012-06-03 DIAGNOSIS — Z8679 Personal history of other diseases of the circulatory system: Secondary | ICD-10-CM | POA: Insufficient documentation

## 2012-06-03 DIAGNOSIS — IMO0002 Reserved for concepts with insufficient information to code with codable children: Secondary | ICD-10-CM | POA: Insufficient documentation

## 2012-06-03 DIAGNOSIS — M542 Cervicalgia: Secondary | ICD-10-CM

## 2012-06-03 MED ORDER — MELOXICAM 15 MG PO TABS: 15.0000 mg | ORAL_TABLET | Freq: Every day | ORAL | Status: DC

## 2012-06-03 MED ORDER — CYCLOBENZAPRINE HCL 10 MG PO TABS: 10.0000 mg | ORAL_TABLET | Freq: Three times a day (TID) | ORAL | Status: DC | PRN

## 2012-06-03 MED ORDER — HYDROCODONE-ACETAMINOPHEN 5-325 MG PO TABS: 2.0000 | ORAL_TABLET | ORAL | Status: AC | PRN

## 2012-06-03 NOTE — ED Provider Notes (Signed)
Medical screening examination/treatment/procedure(s) were performed by non-physician practitioner and as supervising physician I was immediately available for consultation/collaboration.  Doug Sou, MD 06/03/12 2342

## 2012-06-03 NOTE — ED Notes (Signed)
Pt has had chronic neck pain for 5 years. States he is to begin the Pain Clinic next year. Here for pain med to help him until then.

## 2012-06-03 NOTE — ED Provider Notes (Signed)
History     CSN: [id number]  Arrival date & time 06/03/12  1933   First MD Initiated Contact with Patient 06/03/12 2219      Chief Complaint  Patient presents with  . Neck Pain    (Consider location/radiation/quality/duration/timing/severity/associated sxs/prior treatment) Patient is a 26 y.o. male presenting with neck injury. The history is provided by the patient. No language interpreter was used.  Neck Injury This is a new problem. Episode onset: months. The problem occurs constantly. The problem has been unchanged. The symptoms are aggravated by bending. He has tried nothing for the symptoms.  Pt reports he has been having pain to his neck for years.  Pt complains of pain.   Pt reports he saw Dr. Pearletha Forge and he referred him to pain management.  Pt reports he is scheduled to go to the pain clinic in February.  Past Medical History  Diagnosis Date  . Heart murmur   . Herniated disc   . MVP (mitral valve prolapse)     Past Surgical History  Procedure Date  . Cholecystectomy     Family History  Problem Relation Age of Onset  . Hypertension Father   . Diabetes Neg Hx   . Heart attack Neg Hx   . Hyperlipidemia Neg Hx   . Sudden death Neg Hx     History  Substance Use Topics  . Smoking status: Current Every Day Smoker -- 1.0 packs/day    Types: Cigarettes  . Smokeless tobacco: Not on file  . Alcohol Use: Yes      Review of Systems  All other systems reviewed and are negative.    Allergies  Review of patient's allergies indicates no known allergies.  Home Medications   Current Outpatient Rx  Name  Route  Sig  Dispense  Refill  . AZITHROMYCIN 250 MG PO TABS   Oral   Take 1 tablet (250 mg total) by mouth daily. Take first 2 tablets together, then 1 every day until finished.   6 tablet   0   . CYCLOBENZAPRINE HCL 10 MG PO TABS   Oral   Take 1 tablet (10 mg total) by mouth 3 (three) times daily as needed for muscle spasms.   90 tablet   0   . MUCINEX  DM 30-600 MG PO TB12   Oral   Take 1 tablet by mouth every 12 (twelve) hours.   14 each   0   . ALKA-SELTZER PLS SINUS & COUGH PO   Oral   Take by mouth.         . MELOXICAM 15 MG PO TABS   Oral   Take 1 tablet (15 mg total) by mouth daily. With food.   30 tablet   1   . NAPROXEN 500 MG PO TABS   Oral   Take 1 tablet (500 mg total) by mouth 2 (two) times daily.   14 tablet   0   . PREDNISONE 10 MG PO TABS      6,5,4,3,2,1 taper   21 tablet   0     BP 133/88  Pulse 104  Temp 98.4 F (36.9 C) (Oral)  Resp 18  Ht 5\' 11"  (1.803 m)  Wt 178 lb (80.74 kg)  BMI 24.83 kg/m2  SpO2 96%  Physical Exam  Nursing note and vitals reviewed. Constitutional: He appears well-developed and well-nourished.  HENT:  Head: Normocephalic.  Right Ear: External ear normal.  Eyes: Pupils are equal, round, and reactive to  light.  Neck:       Tender c spine diffusely  Cardiovascular: Normal rate.   Pulmonary/Chest: Effort normal.  Abdominal: Soft.  Musculoskeletal: Normal range of motion.  Neurological: He is alert.  Skin: Skin is warm.    ED Course  Procedures (including critical care time)  Labs Reviewed - No data to display No results found.   No diagnosis found.    MDM  MRI reviewed from October,  Dr. Lazaro Arms notes reviewed.  Pt given referrals for primary care MD's.  Pt given Rx for vicodin, flexeril and meloxicam.          Elson Areas, PA 06/03/12 2240

## 2012-06-28 ENCOUNTER — Emergency Department (HOSPITAL_BASED_OUTPATIENT_CLINIC_OR_DEPARTMENT_OTHER)
Admission: EM | Admit: 2012-06-28 | Discharge: 2012-06-28 | Disposition: A | Discharge status: Home / Self Care | Payer: No Typology Code available for payment source | Attending: Emergency Medicine | Admitting: Emergency Medicine

## 2012-06-28 ENCOUNTER — Encounter (HOSPITAL_BASED_OUTPATIENT_CLINIC_OR_DEPARTMENT_OTHER): Payer: Self-pay

## 2012-06-28 DIAGNOSIS — Z791 Long term (current) use of non-steroidal anti-inflammatories (NSAID): Secondary | ICD-10-CM | POA: Insufficient documentation

## 2012-06-28 DIAGNOSIS — M542 Cervicalgia: Secondary | ICD-10-CM

## 2012-06-28 DIAGNOSIS — F17200 Nicotine dependence, unspecified, uncomplicated: Secondary | ICD-10-CM | POA: Insufficient documentation

## 2012-06-28 DIAGNOSIS — G8929 Other chronic pain: Secondary | ICD-10-CM | POA: Insufficient documentation

## 2012-06-28 DIAGNOSIS — Z79899 Other long term (current) drug therapy: Secondary | ICD-10-CM | POA: Insufficient documentation

## 2012-06-28 DIAGNOSIS — R011 Cardiac murmur, unspecified: Secondary | ICD-10-CM | POA: Insufficient documentation

## 2012-06-28 DIAGNOSIS — Z8679 Personal history of other diseases of the circulatory system: Secondary | ICD-10-CM | POA: Insufficient documentation

## 2012-06-28 MED ORDER — HYDROCODONE-ACETAMINOPHEN 7.5-500 MG/15ML PO SOLN: 15.0000 mL | Freq: Four times a day (QID) | ORAL | Status: DC | PRN

## 2012-06-28 MED ORDER — METHOCARBAMOL 750 MG PO TABS: 750.0000 mg | ORAL_TABLET | Freq: Two times a day (BID) | ORAL | Status: DC

## 2012-06-28 MED ORDER — TRAMADOL HCL 50 MG PO TABS
50.0000 mg | ORAL_TABLET | Freq: Once | ORAL | Status: AC
  Administered 2012-06-28: 50 mg via ORAL
  Filled 2012-06-28: qty 1

## 2012-06-28 MED ORDER — NAPROXEN 375 MG PO TABS: 375.0000 mg | ORAL_TABLET | Freq: Two times a day (BID) | ORAL | Status: DC

## 2012-06-28 NOTE — ED Provider Notes (Signed)
History     CSN: [id number]  Arrival date & time 06/28/12  4098   First MD Initiated Contact with Patient 06/28/12 [phone]      Chief Complaint  Patient presents with  . Neck Pain    (Consider location/radiation/quality/duration/timing/severity/associated sxs/prior treatment) Patient is a 27 y.o. male presenting with neck pain. The history is provided by the patient. No language interpreter was used.  Neck Pain  This is a chronic problem. The current episode started more than 1 week ago. The problem occurs constantly. The problem has not changed since onset.The pain is associated with nothing. There has been no fever. The pain is present in the generalized neck. The quality of the pain is described as stabbing. Radiates to: none. The pain is severe. The symptoms are aggravated by bending and twisting. Pertinent negatives include no photophobia, no chest pain, no syncope, no numbness, no weight loss, no paresis, no tingling and no weakness. He has tried nothing for the symptoms. The treatment provided no relief.    Past Medical History  Diagnosis Date  . Heart murmur   . Herniated disc   . MVP (mitral valve prolapse)     Past Surgical History  Procedure Date  . Cholecystectomy     Family History  Problem Relation Age of Onset  . Hypertension Father   . Diabetes Neg Hx   . Heart attack Neg Hx   . Hyperlipidemia Neg Hx   . Sudden death Neg Hx     History  Substance Use Topics  . Smoking status: Current Every Day Smoker -- 1.0 packs/day    Types: Cigarettes  . Smokeless tobacco: Not on file  . Alcohol Use: Yes      Review of Systems  Constitutional: Negative for weight loss.  HENT: Positive for neck pain.   Eyes: Negative for photophobia.  Cardiovascular: Negative for chest pain and syncope.  Neurological: Negative for tingling, weakness and numbness.  All other systems reviewed and are negative.    Allergies  Review of patient's allergies indicates no known  allergies.  Home Medications   Current Outpatient Rx  Name  Route  Sig  Dispense  Refill  . ALKA-SELTZER PLS SINUS & COUGH PO   Oral   Take by mouth.         Marland Kitchen HYDROCODONE-ACETAMINOPHEN 7.5-500 MG/15ML PO SOLN   Oral   Take 15 mLs by mouth every 6 (six) hours as needed for pain.   120 mL   0   . METHOCARBAMOL 750 MG PO TABS   Oral   Take 1 tablet (750 mg total) by mouth 2 (two) times daily.   20 tablet   0   . NAPROXEN 375 MG PO TABS   Oral   Take 1 tablet (375 mg total) by mouth 2 (two) times daily.   20 tablet   0     BP 141/73  Pulse 64  Temp 98.4 F (36.9 C)  Resp 18  SpO2 98%  Physical Exam  Constitutional: He is oriented to person, place, and time. He appears well-developed and well-nourished. No distress.  HENT:  Head: Normocephalic and atraumatic.  Mouth/Throat: Oropharynx is clear and moist.  Eyes: Pupils are equal, round, and reactive to light.  Neck: Normal range of motion. Neck supple. No JVD present. No tracheal deviation present.  Cardiovascular: Normal rate, regular rhythm and intact distal pulses.   Pulmonary/Chest: Effort normal and breath sounds normal. No stridor. He has no wheezes. He has no  rales.  Abdominal: Soft. Bowel sounds are normal. There is no tenderness.  Musculoskeletal: Normal range of motion.       5/5 strength   Lymphadenopathy:    He has no cervical adenopathy.  Neurological: He is alert and oriented to person, place, and time. He has normal reflexes.  Skin: Skin is warm and dry.  Psychiatric: He has a normal mood and affect.    ED Course  Procedures (including critical care time)  Labs Reviewed - No data to display No results found.   1. Chronic neck pain       MDM  Patient needs to follow up with PMD and pain management for ongoing care       Jette Lewan K Rishit Burkhalter-Rasch, MD 06/28/12 [phone]

## 2012-06-28 NOTE — ED Notes (Signed)
Patient reports that he has chronic neck pain from disc disease. To go to pain clinic in February, denies new trauma but reports increased pain.

## 2012-07-07 ENCOUNTER — Encounter (HOSPITAL_BASED_OUTPATIENT_CLINIC_OR_DEPARTMENT_OTHER): Payer: Self-pay | Admitting: *Deleted

## 2012-07-07 ENCOUNTER — Emergency Department (HOSPITAL_BASED_OUTPATIENT_CLINIC_OR_DEPARTMENT_OTHER)
Admission: EM | Admit: 2012-07-07 | Discharge: 2012-07-07 | Disposition: A | Discharge status: Home / Self Care | Payer: No Typology Code available for payment source | Attending: Emergency Medicine | Admitting: Emergency Medicine

## 2012-07-07 DIAGNOSIS — Z5189 Encounter for other specified aftercare: Secondary | ICD-10-CM

## 2012-07-07 DIAGNOSIS — F17200 Nicotine dependence, unspecified, uncomplicated: Secondary | ICD-10-CM | POA: Insufficient documentation

## 2012-07-07 DIAGNOSIS — Z4801 Encounter for change or removal of surgical wound dressing: Secondary | ICD-10-CM | POA: Insufficient documentation

## 2012-07-07 DIAGNOSIS — R011 Cardiac murmur, unspecified: Secondary | ICD-10-CM | POA: Insufficient documentation

## 2012-07-07 DIAGNOSIS — Z79899 Other long term (current) drug therapy: Secondary | ICD-10-CM | POA: Insufficient documentation

## 2012-07-07 DIAGNOSIS — Z8679 Personal history of other diseases of the circulatory system: Secondary | ICD-10-CM | POA: Insufficient documentation

## 2012-07-07 MED ORDER — HYDROCODONE-ACETAMINOPHEN 5-325 MG PO TABS: 1.0000 | ORAL_TABLET | Freq: Four times a day (QID) | ORAL | Status: DC | PRN

## 2012-07-07 NOTE — ED Notes (Signed)
Pt states lacerations to his right forearm seen at [facility] sutured states is still painful and they didn't give him a school note

## 2012-07-07 NOTE — ED Provider Notes (Signed)
History     CSN: [id number]  Arrival date & time 07/07/12  1021   First MD Initiated Contact with Patient 07/07/12 1112      Chief Complaint  Patient presents with  . Arm Pain    (Consider location/radiation/quality/duration/timing/severity/associated sxs/prior treatment) Patient is a 27 y.o. male presenting with arm pain. The history is provided by the patient.  Arm Pain This is a new (lacerated his arm from a putting his hand through a glass door) problem. The current episode started yesterday. The problem occurs constantly. The problem has not changed since onset.Associated symptoms comments: No drainage or redness. The symptoms are aggravated by twisting (palpation, movement). Nothing relieves the symptoms. He has tried acetaminophen for the symptoms. The treatment provided no relief.    Past Medical History  Diagnosis Date  . Heart murmur   . Herniated disc   . MVP (mitral valve prolapse)     Past Surgical History  Procedure Date  . Cholecystectomy     Family History  Problem Relation Age of Onset  . Hypertension Father   . Diabetes Neg Hx   . Heart attack Neg Hx   . Hyperlipidemia Neg Hx   . Sudden death Neg Hx     History  Substance Use Topics  . Smoking status: Current Every Day Smoker -- 1.0 packs/day    Types: Cigarettes  . Smokeless tobacco: Not on file  . Alcohol Use: Yes      Review of Systems  All other systems reviewed and are negative.    Allergies  Review of patient's allergies indicates no known allergies.  Home Medications   Current Outpatient Rx  Name  Route  Sig  Dispense  Refill  . ALKA-SELTZER PLS SINUS & COUGH PO   Oral   Take by mouth.         Marland Kitchen HYDROCODONE-ACETAMINOPHEN 7.5-500 MG/15ML PO SOLN   Oral   Take 15 mLs by mouth every 6 (six) hours as needed for pain.   60 mL   0   . HYDROCODONE-ACETAMINOPHEN 5-325 MG PO TABS   Oral   Take 1 tablet by mouth every 6 (six) hours as needed for pain.   15 tablet   0   .  METHOCARBAMOL 750 MG PO TABS   Oral   Take 1 tablet (750 mg total) by mouth 2 (two) times daily.   20 tablet   0   . NAPROXEN 375 MG PO TABS   Oral   Take 1 tablet (375 mg total) by mouth 2 (two) times daily.   20 tablet   0     BP 125/76  Pulse 68  Temp 97.9 F (36.6 C) (Oral)  Resp 20  Ht 5\' 11"  (1.803 m)  Wt 175 lb (79.379 kg)  BMI 24.41 kg/m2  SpO2 98%  Physical Exam  Nursing note and vitals reviewed. Constitutional: He is oriented to person, place, and time. He appears well-developed and well-nourished. No distress.  HENT:  Head: Normocephalic and atraumatic.  Pulmonary/Chest: Effort normal.  Musculoskeletal:       Arms:      Multiple repaired lacerations over the right forearm. Surrounding contusion near the lacerations. No erythema, fluctuance or drainage. 2+ radial pulse  Neurological: He is alert and oriented to person, place, and time. He has normal strength. No sensory deficit.  Skin: Skin is warm and dry. No erythema.    ED Course  Procedures (including critical care time)  Labs Reviewed - No data  to display No results found.   1. Encounter for wound re-check       MDM   Patient is here due to worsening pain in his arm after an extensive laceration repair yesterday. Neurovascularly intact and has no sign of infection. Sutures are intact and wounds are well appearing. Patient given medication for pain as well as a note for school       Gwyneth Sprout, MD 07/07/12 1122

## 2012-07-09 ENCOUNTER — Encounter (HOSPITAL_BASED_OUTPATIENT_CLINIC_OR_DEPARTMENT_OTHER): Payer: Self-pay | Admitting: *Deleted

## 2012-07-09 ENCOUNTER — Emergency Department (HOSPITAL_BASED_OUTPATIENT_CLINIC_OR_DEPARTMENT_OTHER)
Admission: EM | Admit: 2012-07-09 | Discharge: 2012-07-09 | Disposition: A | Discharge status: Home / Self Care | Payer: No Typology Code available for payment source | Attending: Emergency Medicine | Admitting: Emergency Medicine

## 2012-07-09 DIAGNOSIS — Z8739 Personal history of other diseases of the musculoskeletal system and connective tissue: Secondary | ICD-10-CM | POA: Insufficient documentation

## 2012-07-09 DIAGNOSIS — Z8679 Personal history of other diseases of the circulatory system: Secondary | ICD-10-CM | POA: Insufficient documentation

## 2012-07-09 DIAGNOSIS — IMO0002 Reserved for concepts with insufficient information to code with codable children: Secondary | ICD-10-CM

## 2012-07-09 DIAGNOSIS — Z87828 Personal history of other (healed) physical injury and trauma: Secondary | ICD-10-CM | POA: Insufficient documentation

## 2012-07-09 DIAGNOSIS — Z4800 Encounter for change or removal of nonsurgical wound dressing: Secondary | ICD-10-CM | POA: Insufficient documentation

## 2012-07-09 DIAGNOSIS — F17200 Nicotine dependence, unspecified, uncomplicated: Secondary | ICD-10-CM | POA: Insufficient documentation

## 2012-07-09 NOTE — ED Provider Notes (Signed)
History     CSN: [id number]  Arrival date & time 07/09/12  1528   First MD Initiated Contact with Patient 07/09/12 1629      Chief Complaint  Patient presents with  . Wound Check    (Consider location/radiation/quality/duration/timing/severity/associated sxs/prior treatment) Patient is a 27 y.o. male presenting with wound check. The history is provided by the patient.  Wound Check  Treated in ED: 4 days ago. Previous treatment in the ED includes laceration repair. Treatments tried: sutures coming loose.   Pt was sutured at [facility] after putting his hand thru a glass.  Pt reports sutures are coming out Past Medical History  Diagnosis Date  . Heart murmur   . Herniated disc   . MVP (mitral valve prolapse)     Past Surgical History  Procedure Date  . Cholecystectomy     Family History  Problem Relation Age of Onset  . Hypertension Father   . Diabetes Neg Hx   . Heart attack Neg Hx   . Hyperlipidemia Neg Hx   . Sudden death Neg Hx     History  Substance Use Topics  . Smoking status: Current Every Day Smoker -- 1.0 packs/day    Types: Cigarettes  . Smokeless tobacco: Not on file  . Alcohol Use: Yes      Review of Systems  All other systems reviewed and are negative.    Allergies  Review of patient's allergies indicates no known allergies.  Home Medications   Current Outpatient Rx  Name  Route  Sig  Dispense  Refill  . ALKA-SELTZER PLS SINUS & COUGH PO   Oral   Take by mouth.         Marland Kitchen HYDROCODONE-ACETAMINOPHEN 7.5-500 MG/15ML PO SOLN   Oral   Take 15 mLs by mouth every 6 (six) hours as needed for pain.   60 mL   0   . HYDROCODONE-ACETAMINOPHEN 5-325 MG PO TABS   Oral   Take 1 tablet by mouth every 6 (six) hours as needed for pain.   15 tablet   0   . METHOCARBAMOL 750 MG PO TABS   Oral   Take 1 tablet (750 mg total) by mouth 2 (two) times daily.   20 tablet   0   . NAPROXEN 375 MG PO TABS   Oral   Take 1 tablet (375 mg total)  by mouth 2 (two) times daily.   20 tablet   0     BP 119/70  Pulse 64  Temp 98 F (36.7 C) (Oral)  Resp 18  SpO2 100%  Physical Exam  Nursing note and vitals reviewed. Constitutional: He is oriented to person, place, and time. He appears well-developed and well-nourished.  HENT:  Head: Normocephalic and atraumatic.  Musculoskeletal:       Healing lacertion right forearm,  Running suture has come loose on one end and pulled partially pulled thru  Neurological: He is alert and oriented to person, place, and time.    ED Course  Procedures (including critical care time)  Labs Reviewed - No data to display No results found.   No diagnosis found.    MDM  Wound steri strips.   Pt advised suture removal as scheduled        Lonia Skinner [place], [place] 07/09/12 1713

## 2012-07-09 NOTE — ED Provider Notes (Signed)
Medical screening examination/treatment/procedure(s) were performed by non-physician practitioner and as supervising physician I was immediately available for consultation/collaboration.   Gavin Pound. Anjeli Casad, MD 07/09/12 1714

## 2012-07-09 NOTE — ED Notes (Signed)
Patient got stitches Thursday and now they are coming out. Patient put his arm through a glass door and got treated at [facility]

## 2012-07-16 ENCOUNTER — Emergency Department (HOSPITAL_BASED_OUTPATIENT_CLINIC_OR_DEPARTMENT_OTHER)
Admission: EM | Admit: 2012-07-16 | Discharge: 2012-07-16 | Disposition: A | Discharge status: Home / Self Care | Payer: No Typology Code available for payment source | Attending: Emergency Medicine | Admitting: Emergency Medicine

## 2012-07-16 ENCOUNTER — Encounter (HOSPITAL_BASED_OUTPATIENT_CLINIC_OR_DEPARTMENT_OTHER): Payer: Self-pay | Admitting: *Deleted

## 2012-07-16 DIAGNOSIS — S0993XA Unspecified injury of face, initial encounter: Secondary | ICD-10-CM | POA: Insufficient documentation

## 2012-07-16 DIAGNOSIS — Z791 Long term (current) use of non-steroidal anti-inflammatories (NSAID): Secondary | ICD-10-CM | POA: Insufficient documentation

## 2012-07-16 DIAGNOSIS — Z4802 Encounter for removal of sutures: Secondary | ICD-10-CM | POA: Insufficient documentation

## 2012-07-16 DIAGNOSIS — Z79899 Other long term (current) drug therapy: Secondary | ICD-10-CM | POA: Insufficient documentation

## 2012-07-16 DIAGNOSIS — X500XXA Overexertion from strenuous movement or load, initial encounter: Secondary | ICD-10-CM | POA: Insufficient documentation

## 2012-07-16 DIAGNOSIS — Y929 Unspecified place or not applicable: Secondary | ICD-10-CM | POA: Insufficient documentation

## 2012-07-16 DIAGNOSIS — M542 Cervicalgia: Secondary | ICD-10-CM

## 2012-07-16 DIAGNOSIS — Y939 Activity, unspecified: Secondary | ICD-10-CM | POA: Insufficient documentation

## 2012-07-16 DIAGNOSIS — Z8679 Personal history of other diseases of the circulatory system: Secondary | ICD-10-CM | POA: Insufficient documentation

## 2012-07-16 DIAGNOSIS — R011 Cardiac murmur, unspecified: Secondary | ICD-10-CM | POA: Insufficient documentation

## 2012-07-16 DIAGNOSIS — F17200 Nicotine dependence, unspecified, uncomplicated: Secondary | ICD-10-CM | POA: Insufficient documentation

## 2012-07-16 DIAGNOSIS — IMO0002 Reserved for concepts with insufficient information to code with codable children: Secondary | ICD-10-CM | POA: Insufficient documentation

## 2012-07-16 DIAGNOSIS — S199XXA Unspecified injury of neck, initial encounter: Secondary | ICD-10-CM | POA: Insufficient documentation

## 2012-07-16 MED ORDER — HYDROCODONE-ACETAMINOPHEN 5-325 MG PO TABS: 2.0000 | ORAL_TABLET | Freq: Four times a day (QID) | ORAL | Status: DC | PRN

## 2012-07-16 NOTE — ED Notes (Signed)
C/o neck pain for 4-5 days "popped my neck this week". Also here for suture removal

## 2012-07-16 NOTE — ED Provider Notes (Signed)
History   This chart was scribed for Geoffery Lyons, MD by Melba Coon, ED Scribe. The patient was seen in room MH07/MH07 and the patient's care was started at 15:30.    CSN: [id number]  Arrival date & time 07/16/12  1621   First MD Initiated Contact with Patient 07/16/12 1709      Chief Complaint  Patient presents with  . Neck Pain    (Consider location/radiation/quality/duration/timing/severity/associated sxs/prior treatment) The history is provided by the patient. No language interpreter was used.   Justin Morgan is a 27 y.o. male who presents to the Emergency Department complaining of constant, moderate to severe neck pain with an onset 4 days ago. Justin Morgan reports he felt a "poppng" sensation in his neck. Vicodin alleviates the pain. Denies HA, fever, sore throat, rash, back pain, CP, SOB, abdominal pain, nausea, emesis, diarrhea, dysuria, or extremity edema, weakness, numbness, or tingling. He also reports he is here for suture removal from his right forearm and wrist when he punched through glass. He reports that some stitches have fallen out and has come back to the ED multiple times for wound closure. He has a history of cervical ruptured discs; last MRI was done 10/13 per his report. No known allergies. No other pertinent medical symptoms.  Past Medical History  Diagnosis Date  . Heart murmur   . Herniated disc   . MVP (mitral valve prolapse)     Past Surgical History  Procedure Date  . Cholecystectomy     Family History  Problem Relation Age of Onset  . Hypertension Father   . Diabetes Neg Hx   . Heart attack Neg Hx   . Hyperlipidemia Neg Hx   . Sudden death Neg Hx     History  Substance Use Topics  . Smoking status: Current Every Day Smoker -- 1.0 packs/day    Types: Cigarettes  . Smokeless tobacco: Never Used  . Alcohol Use: 0.6 oz/week    1 Cans of beer per week      Review of Systems  HENT: Positive for neck pain.    10 Systems reviewed and  all are negative for acute change except as noted in the HPI.   Allergies  Review of patient's allergies indicates no known allergies.  Home Medications   Current Outpatient Rx  Name  Route  Sig  Dispense  Refill  . HYDROCODONE-ACETAMINOPHEN 5-325 MG PO TABS   Oral   Take 1 tablet by mouth every 6 (six) hours as needed for pain.   15 tablet   0   . IBUPROFEN 200 MG PO TABS   Oral   Take 800 mg by mouth 2 (two) times daily.         Marland Kitchen ALKA-SELTZER PLS SINUS & COUGH PO   Oral   Take by mouth.         Marland Kitchen HYDROCODONE-ACETAMINOPHEN 7.5-500 MG/15ML PO SOLN   Oral   Take 15 mLs by mouth every 6 (six) hours as needed for pain.   60 mL   0   . METHOCARBAMOL 750 MG PO TABS   Oral   Take 1 tablet (750 mg total) by mouth 2 (two) times daily.   20 tablet   0   . NAPROXEN 375 MG PO TABS   Oral   Take 1 tablet (375 mg total) by mouth 2 (two) times daily.   20 tablet   0     BP 127/78  Pulse 90  Temp 97.8 F (  36.6 C) (Oral)  Resp 18  SpO2 98%  Physical Exam  Nursing note and vitals reviewed. Constitutional:       Awake, alert, nontoxic appearance.  HENT:  Head: Atraumatic.  Eyes: Right eye exhibits no discharge. Left eye exhibits no discharge.  Neck: Neck supple.  Pulmonary/Chest: Effort normal. He exhibits no tenderness.  Abdominal: Soft. There is no tenderness. There is no rebound.  Musculoskeletal: He exhibits no tenderness.       There is TTP in the soft tissues of low cervical spine. There is pain with ROM but no nuchal rigidity. teh bilateral upper extremities have normal and symmetric strength and sensation. Baseline ROM, no obvious new focal weakness.  The right forearm has several healing lacerations. There is no erythema or purulent drainage.  Neurological:       Mental status and motor strength appears baseline for patient and situation.  Skin: No rash noted.  Psychiatric: He has a normal mood and affect.    ED Course  Procedures (including  critical care time)  DIAGNOSTIC STUDIES: Oxygen Saturation is 98% on room air, normal by my interpretation.    COORDINATION OF CARE:  5:29PM - vicodin will be Rx for Justin Morgan. Suture were removed from his right forearm and wrist. He is advised to f/u with PCP or pain management center and is told that he cannot continue to f/u with ED for pain management. He is ready for d/c.   Labs Reviewed - No data to display No results found.   No diagnosis found.    MDM  Sutures removed from the lacerations.    I will prescribe him a few vicodin awaiting pain management appointment.  He needs pcp to prescribe his pain meds.  Multiple refills here already for this.     I personally performed the services described in this documentation, which was scribed in my presence. The recorded information has been reviewed and is accurate.          Geoffery Lyons, MD 07/16/12 2258

## 2012-07-31 ENCOUNTER — Encounter: Payer: Self-pay | Admitting: Physical Medicine & Rehabilitation

## 2012-07-31 ENCOUNTER — Ambulatory Visit (HOSPITAL_BASED_OUTPATIENT_CLINIC_OR_DEPARTMENT_OTHER): Payer: No Typology Code available for payment source | Admitting: Physical Medicine & Rehabilitation

## 2012-07-31 ENCOUNTER — Encounter: Payer: No Typology Code available for payment source | Attending: Physical Medicine & Rehabilitation

## 2012-07-31 VITALS — BP 138/75 | HR 82 | Resp 14 | Ht 71.0 in | Wt 173.0 lb

## 2012-07-31 DIAGNOSIS — M5030 Other cervical disc degeneration, unspecified cervical region: Secondary | ICD-10-CM | POA: Insufficient documentation

## 2012-07-31 DIAGNOSIS — M542 Cervicalgia: Secondary | ICD-10-CM | POA: Insufficient documentation

## 2012-07-31 MED ORDER — CYCLOBENZAPRINE HCL 10 MG PO TABS: 10.0000 mg | ORAL_TABLET | Freq: Every evening | ORAL | Status: DC | PRN

## 2012-07-31 MED ORDER — TRAMADOL HCL 50 MG PO TABS: 50.0000 mg | ORAL_TABLET | Freq: Three times a day (TID) | ORAL | Status: DC | PRN

## 2012-07-31 NOTE — Progress Notes (Deleted)
  Subjective:    Patient ID: Justin Morgan, male    DOB: 1986-05-30, 27 y.o.   MRN: [id number]  HPI    Review of Systems     Objective:   Physical Exam        Assessment & Plan:

## 2012-07-31 NOTE — Progress Notes (Signed)
Subjective:    Patient ID: Justin Morgan, male    DOB: 1985-07-27, 27 y.o.   MRN: [id number]  HPI No injury, neck pain since 2007.  Medication include nerve medicine , pain medicine, PT 5-6 years ago. R>L shoulder pain Pain Inventory Average Pain 7 Pain Right Now 7 My pain is stabbing and aching  In the last 24 hours, has pain interfered with the following? General activity 7 Relation with others 8 Enjoyment of life 10 What TIME of day is your pain at its worst? morning and night Sleep (in general) Poor  Pain is worse with: walking, bending, sitting, inactivity, standing and some activites Pain improves with: medication Relief from Meds: 0  Mobility walk without assistance how many minutes can you walk? 40 ability to climb steps?  no do you drive?  no  Function not employed: date last employed 2010  Neuro/Psych numbness . Mild C5-C6 central stenosis associated with broad-based disc  osteophyte complex. Mild flattening of the right ventral aspect of  the cervical cord. Foramina appear patent.  2. C6-C7 right paracentral and posterolateral disc extrusion with  mild cranial and caudal migration of disc material. Mild central  stenosis with flattening of the right ventral aspect of the  cervical cord.  I personally reviewed the films. No significant central or foraminal stenosis. Findings appear chronic. Prior Studies Any changes since last visit?  no  Physicians involved in your care Any changes since last visit?  no   Family History  Problem Relation Age of Onset  . Hypertension Father   . Diabetes Neg Hx   . Heart attack Neg Hx   . Hyperlipidemia Neg Hx   . Sudden death Neg Hx    History   Social History  . Marital Status: Single    Spouse Name: N/A    Number of Children: N/A  . Years of Education: N/A   Social History Main Topics  . Smoking status: Current Every Day Smoker -- 1.0 packs/day    Types: Cigarettes  . Smokeless tobacco: Never Used  .  Alcohol Use: 0.6 oz/week    1 Cans of beer per week  . Drug Use: No  . Sexually Active: None   Other Topics Concern  . None   Social History Narrative  . None   Past Surgical History  Procedure Date  . Cholecystectomy    Past Medical History  Diagnosis Date  . Heart murmur   . Herniated disc   . MVP (mitral valve prolapse)    BP 138/75  Pulse 82  Resp 14  Ht 5\' 11"  (1.803 m)  Wt 173 lb (78.472 kg)  BMI 24.13 kg/m2  SpO2 97%    Review of Systems  HENT: Positive for neck pain.   Neurological: Positive for numbness.  All other systems reviewed and are negative.       Objective:   Physical Exam  Nursing note and vitals reviewed. Constitutional: He is oriented to person, place, and time. He appears well-developed and well-nourished.  HENT:  Head: Normocephalic and atraumatic.  Eyes: Conjunctivae normal and EOM are normal. Pupils are equal, round, and reactive to light.  Neck: Normal range of motion. Neck supple.  Musculoskeletal: Normal range of motion.  Neurological: He is alert and oriented to person, place, and time. He has normal strength and normal reflexes. No sensory deficit. He exhibits normal muscle tone. Gait normal.  Psychiatric: He has a normal mood and affect.  Assessment & Plan:  1. 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 No evidence of radiculopathy or myelopathy. Discussed the natural history and treatment of this condition. This is a chronic issue and there is no quick fix. Do not think patient needs another surgical referral. No spine injections indicated. Will refer to physical therapy for cervicothoracic stabilization program, moderate aerobic exercise program, instruct home exercise program Start Flexeril 10 mg each bedtime Tramadol 50 mg 3 times a day and if this is not helpful may increase 100 mg. Otherwise can go back to nonsteroidal anti-inflammatories  Nonnarcotic treatment Followup with physician assistant one  month Discuss treatment plan with patient and his mother. Over half of the 30 minute visit was spent counseling coordination of care

## 2012-08-03 ENCOUNTER — Ambulatory Visit
Payer: No Typology Code available for payment source | Attending: Physical Medicine & Rehabilitation | Admitting: Physical Therapy

## 2012-08-03 DIAGNOSIS — M542 Cervicalgia: Secondary | ICD-10-CM | POA: Insufficient documentation

## 2012-08-03 DIAGNOSIS — IMO0001 Reserved for inherently not codable concepts without codable children: Secondary | ICD-10-CM | POA: Insufficient documentation

## 2012-08-03 DIAGNOSIS — M2569 Stiffness of other specified joint, not elsewhere classified: Secondary | ICD-10-CM | POA: Insufficient documentation

## 2012-08-07 ENCOUNTER — Ambulatory Visit: Payer: No Typology Code available for payment source | Admitting: Rehabilitation

## 2012-08-09 ENCOUNTER — Ambulatory Visit: Payer: No Typology Code available for payment source | Admitting: Physical Therapy

## 2012-08-11 ENCOUNTER — Ambulatory Visit: Payer: No Typology Code available for payment source | Admitting: Rehabilitation

## 2012-08-15 ENCOUNTER — Ambulatory Visit: Payer: No Typology Code available for payment source | Admitting: Rehabilitation

## 2012-08-17 ENCOUNTER — Ambulatory Visit: Payer: No Typology Code available for payment source | Admitting: Rehabilitation

## 2012-08-25 ENCOUNTER — Encounter: Payer: Self-pay | Admitting: Physical Medicine and Rehabilitation

## 2012-08-25 ENCOUNTER — Encounter
Payer: No Typology Code available for payment source | Attending: Physical Medicine and Rehabilitation | Admitting: Physical Medicine and Rehabilitation

## 2012-08-25 VITALS — BP 124/77 | HR 97 | Resp 14 | Ht 71.0 in | Wt 170.0 lb

## 2012-08-25 DIAGNOSIS — F17200 Nicotine dependence, unspecified, uncomplicated: Secondary | ICD-10-CM | POA: Insufficient documentation

## 2012-08-25 DIAGNOSIS — G8929 Other chronic pain: Secondary | ICD-10-CM | POA: Insufficient documentation

## 2012-08-25 DIAGNOSIS — Z5181 Encounter for therapeutic drug level monitoring: Secondary | ICD-10-CM

## 2012-08-25 DIAGNOSIS — M5010 Cervical disc disorder with radiculopathy, unspecified cervical region: Secondary | ICD-10-CM

## 2012-08-25 DIAGNOSIS — M47812 Spondylosis without myelopathy or radiculopathy, cervical region: Secondary | ICD-10-CM | POA: Insufficient documentation

## 2012-08-25 DIAGNOSIS — M542 Cervicalgia: Secondary | ICD-10-CM | POA: Insufficient documentation

## 2012-08-25 DIAGNOSIS — M5020 Other cervical disc displacement, unspecified cervical region: Secondary | ICD-10-CM | POA: Insufficient documentation

## 2012-08-25 NOTE — Progress Notes (Signed)
Subjective:    Patient ID: Justin Morgan, male    DOB: 04/18/1986, 27 y.o.   MRN: [id number]  HPI The patient is a 27 year old male, who presents with chronic neck pain . The symptoms started about 7 years ago. The patient complains about moderate - severe pain   , which radiate to his right UE in a C7 distribution intermittendly. Patient also complains about numbness and tingling in his right hand . He describes the pain as aching and stabbing at times  . Sometimes changing positions alleviate the symptoms. Prolonged staying in a "bad" position aggrevates the symptoms. The patient grades his pain as a  6/10. Is doing PT which does only  Help some. Pain Inventory Average Pain 7 Pain Right Now 6 My pain is constant, sharp and aching  In the last 24 hours, has pain interfered with the following? General activity 5 Relation with others 5 Enjoyment of life 5 What TIME of day is your pain at its worst? night Sleep (in general) Fair  Pain is worse with: some activites Pain improves with: rest and pacing activities Relief from Meds: 0  Mobility walk without assistance how many minutes can you walk? 30 ability to climb steps?  yes do you drive?  no Do you have any goals in this area?  yes  Function not employed: date last employed unemployed Do you have any goals in this area?  yes  Neuro/Psych weakness numbness tingling  Prior Studies Any changes since last visit?  no  Physicians involved in your care Any changes since last visit?  no   Family History  Problem Relation Age of Onset  . Hypertension Father   . Diabetes Neg Hx   . Heart attack Neg Hx   . Hyperlipidemia Neg Hx   . Sudden death Neg Hx    History   Social History  . Marital Status: Single    Spouse Name: N/A    Number of Children: N/A  . Years of Education: N/A   Social History Main Topics  . Smoking status: Current Every Day Smoker -- 1.00 packs/day    Types: Cigarettes  . Smokeless tobacco: Never  Used  . Alcohol Use: 0.6 oz/week    1 Cans of beer per week  . Drug Use: No  . Sexually Active: None   Other Topics Concern  . None   Social History Narrative  . None   Past Surgical History  Procedure Laterality Date  . Cholecystectomy     Past Medical History  Diagnosis Date  . Heart murmur   . Herniated disc   . MVP (mitral valve prolapse)    BP 124/77  Pulse 97  Resp 14  Ht 5\' 11"  (1.803 m)  Wt 170 lb (77.111 kg)  BMI 23.72 kg/m2  SpO2 96%     Review of Systems  Musculoskeletal: Positive for myalgias, arthralgias and gait problem.  Neurological: Positive for weakness and numbness.  All other systems reviewed and are negative.       Objective:   Physical Exam  Constitutional: He is oriented to person, place, and time. He appears well-developed and well-nourished.  HENT:  Head: Normocephalic.  Neck: Neck supple.  Musculoskeletal: He exhibits tenderness.  Neurological: He is alert and oriented to person, place, and time.  Skin: Skin is warm and dry.  Psychiatric: He has a normal mood and affect.    Symmetric normal motor tone is noted throughout. Normal muscle bulk. Muscle testing reveals 5/5  muscle strength of the upper extremity, and 5/5 of the lower extremity. Full range of motion in upper and lower extremities. ROM of spine is not restricted. Fine motor movements are normal in both hands.Slight weakness in right thumb, extension and abduction. Sensory is intact and symmetric to light touch, pinprick and proprioception. DTR in the upper ( 1+) and lower extremity (2+) are present and symmetric . No clonus is noted. No Hoffman sign. Patient arises from chair without difficulty. Narrow based gait with normal arm swing bilateral , able to walk on heels and toes . Tandem walk is stable. No pronator drift. Rhomberg negative. Left triceps and biceps same strength as the right side, but patient is right handed.      Assessment & Plan:  1. Cervical Spondylosis  , cervical stenosis Recent MRI shows : IMPRESSION:  1. Mild C5-C6 central stenosis associated with broad-based disc  osteophyte complex. Mild flattening of the right ventral aspect of  the cervical cord. Foramina appear patent.  2. C6-C7 right paracentral and posterolateral disc extrusion with  mild cranial and caudal migration of disc material. Mild central  stenosis with flattening of the right ventral aspect of the  cervical cord. Patient is doing PT, he is not taking any medication, he states, that he has tried multiple medications ( Lyrica , Gabapentin, Flexeril, Robaxin, Mobic, Naproxen, Tramadol, Hydrocodone, Percocet, steroid dose packs) without any success.  I would like to refer him to neurosurgery, but he does not have any insurance, therefore we could not sent him anywhere in [place]. Advised patient to call [facility] for a possible neurosurgical evaluation.   Nonnarcotic treatment at this point per Dr. Doroteo Bradford, ordered an UDS today, in case we will consider narcotics in the future.  Followup with physician assistant one month

## 2012-08-25 NOTE — Addendum Note (Signed)
Addended by: Judd Gaudier on: 08/25/2012 01:05 PM   Modules accepted: Orders

## 2012-08-25 NOTE — Patient Instructions (Signed)
Continue with PT, continue with posture training.

## 2012-08-28 ENCOUNTER — Telehealth: Payer: Self-pay

## 2012-08-28 NOTE — Telephone Encounter (Signed)
Patient says at his last visit Justin Morgan and him discussed other medications.  He would like to know if a decision has been made as to what he can try.  Please advise.

## 2012-08-28 NOTE — Telephone Encounter (Signed)
I told the patient that we will wait for the UDS result, and then I will discuss his case with Dr. Doroteo Bradford, we also discussed at his visit, that he already has taken several different meds without success, he should also try to see a neurosurgeon for his problem

## 2012-08-28 NOTE — Telephone Encounter (Signed)
Patient is self pay and cannot afford to go to neurosurgeon.  He would like [place] to follow up when urine drug screen comes in.

## 2012-08-28 NOTE — Telephone Encounter (Signed)
We know that he has no insurance, but he told us that his ortho, or pcp is trying to get him into [facility], which has a program, where they do surgery if necessary for patients who do not have insurance.

## 2012-09-07 ENCOUNTER — Telehealth: Payer: Self-pay

## 2012-09-07 NOTE — Telephone Encounter (Signed)
I already sent you a message, his UDS was positive for alcohol and oxy, he will be non narcotic

## 2012-09-07 NOTE — Telephone Encounter (Signed)
Patient called to follow up on urine drug screen and find out what his medication options are.  Please advise.

## 2012-09-07 NOTE — Telephone Encounter (Signed)
Patient informed of urine drug screen results and told he will be non-narcotic.  He was not happy with this decision and hung up the phone.

## 2012-09-20 ENCOUNTER — Encounter: Payer: No Typology Code available for payment source | Admitting: Physical Medicine and Rehabilitation

## 2012-09-23 ENCOUNTER — Emergency Department (HOSPITAL_BASED_OUTPATIENT_CLINIC_OR_DEPARTMENT_OTHER)
Admission: EM | Admit: 2012-09-23 | Discharge: 2012-09-23 | Disposition: A | Discharge status: Home / Self Care | Payer: No Typology Code available for payment source | Attending: Emergency Medicine | Admitting: Emergency Medicine

## 2012-09-23 ENCOUNTER — Encounter (HOSPITAL_BASED_OUTPATIENT_CLINIC_OR_DEPARTMENT_OTHER): Payer: Self-pay

## 2012-09-23 DIAGNOSIS — K089 Disorder of teeth and supporting structures, unspecified: Secondary | ICD-10-CM | POA: Insufficient documentation

## 2012-09-23 DIAGNOSIS — K08109 Complete loss of teeth, unspecified cause, unspecified class: Secondary | ICD-10-CM | POA: Insufficient documentation

## 2012-09-23 DIAGNOSIS — Z9889 Other specified postprocedural states: Secondary | ICD-10-CM | POA: Insufficient documentation

## 2012-09-23 DIAGNOSIS — M549 Dorsalgia, unspecified: Secondary | ICD-10-CM | POA: Insufficient documentation

## 2012-09-23 DIAGNOSIS — R011 Cardiac murmur, unspecified: Secondary | ICD-10-CM | POA: Insufficient documentation

## 2012-09-23 DIAGNOSIS — Z8679 Personal history of other diseases of the circulatory system: Secondary | ICD-10-CM | POA: Insufficient documentation

## 2012-09-23 DIAGNOSIS — K0889 Other specified disorders of teeth and supporting structures: Secondary | ICD-10-CM

## 2012-09-23 DIAGNOSIS — Z8739 Personal history of other diseases of the musculoskeletal system and connective tissue: Secondary | ICD-10-CM | POA: Insufficient documentation

## 2012-09-23 DIAGNOSIS — F17200 Nicotine dependence, unspecified, uncomplicated: Secondary | ICD-10-CM | POA: Insufficient documentation

## 2012-09-23 MED ORDER — CLINDAMYCIN HCL 150 MG PO CAPS
300.0000 mg | ORAL_CAPSULE | Freq: Three times a day (TID) | ORAL | Status: DC
Start: 2012-09-23 — End: 2012-11-02

## 2012-09-23 MED ORDER — HYDROCODONE-ACETAMINOPHEN 5-325 MG PO TABS: 1.0000 | ORAL_TABLET | ORAL | Status: DC | PRN

## 2012-09-23 NOTE — ED Notes (Signed)
Pt presents with dental/mouth pain. Pt says he has a "hole" in his mouth. Pt had surgery on his mouth in 2010. Pt thinks he may have been chewing on the inside of his mouth at night and now the area is irritated.

## 2012-09-23 NOTE — ED Provider Notes (Signed)
History     CSN: [id number]  Arrival date & time 09/23/12  1919   First MD Initiated Contact with Patient 09/23/12 2024      Chief Complaint  Patient presents with  . Dental Pain    (Consider location/radiation/quality/duration/timing/severity/associated sxs/prior treatment) HPI Comments: Patient is a 27 y/o M c/o dental pain x 2-3 days. Patient reported that pain is mainly localized to the right lower mandible, first molar - region where patient had surgery for the removal of his wisdom teeth approximately 2-3 years ago. Patient reported a feeling a "little pocket" where pain and discomfort has developed, described as a moderate sore, raw sensation that is intermittent, without radiation. Patient reported that he has used Oragel and is currently taking Tramadol for his back pain - stated that these medications unsuccessfully treated his pain. Patient denied facial swelling, discharge, abscess, cyst formation, inflammation, numbness, paresthesia, gum bleeding, trismus, dysphagia, sore throat, chest pain, shortness of breathe, difficulty breathing.   Patient is a 27 y.o. male presenting with tooth pain. The history is provided by the patient. No language interpreter was used.  Dental PainThe primary symptoms include mouth pain. Primary symptoms do not include dental injury, oral bleeding, headaches, fever, shortness of breath, sore throat or angioedema. The symptoms began 2 days ago. The symptoms are unchanged. The symptoms are new. The symptoms occur intermittently.  Additional symptoms include: gum tenderness. Additional symptoms do not include: dental sensitivity to temperature, gum swelling, trismus, jaw pain, facial swelling, trouble swallowing, pain with swallowing and ear pain. Medical issues include: smoking. Medical issues do not include: alcohol problem and periodontal disease.    Past Medical History  Diagnosis Date  . Heart murmur   . Herniated disc   . MVP (mitral valve  prolapse)     Past Surgical History  Procedure Laterality Date  . Cholecystectomy    . Dental surgery      Family History  Problem Relation Age of Onset  . Hypertension Father   . Diabetes Neg Hx   . Heart attack Neg Hx   . Hyperlipidemia Neg Hx   . Sudden death Neg Hx     History  Substance Use Topics  . Smoking status: Current Every Day Smoker -- 1.00 packs/day    Types: Cigarettes  . Smokeless tobacco: Never Used  . Alcohol Use: 0.6 oz/week    1 Cans of beer per week     Comment: occasional       Review of Systems  Constitutional: Negative for fever and chills.  HENT: Negative for ear pain, sore throat, facial swelling, trouble swallowing and neck pain.        Right lower mouth pain  Respiratory: Negative for chest tightness and shortness of breath.   Cardiovascular: Negative for chest pain.  Gastrointestinal: Negative for nausea, vomiting, abdominal pain and diarrhea.  Neurological: Negative for headaches.  All other systems reviewed and are negative.    Allergies  Review of patient's allergies indicates no known allergies.  Home Medications   Current Outpatient Rx  Name  Route  Sig  Dispense  Refill  . traMADol (ULTRAM) 50 MG tablet   Oral   Take 50 mg by mouth every 6 (six) hours as needed for pain.         . clindamycin (CLEOCIN) 150 MG capsule   Oral   Take 2 capsules (300 mg total) by mouth 3 (three) times daily.   60 capsule   0   . HYDROcodone-acetaminophen (NORCO)  5-325 MG per tablet   Oral   Take 1 tablet by mouth every 4 (four) hours as needed for pain.   6 tablet   0     BP 131/87  Pulse 89  Temp(Src) 97.7 F (36.5 C) (Oral)  Resp 18  Ht 5\' 11"  (1.803 m)  Wt 175 lb (79.379 kg)  BMI 24.42 kg/m2  SpO2 99%  Physical Exam  Nursing note and vitals reviewed. Constitutional: He is oriented to person, place, and time. He appears well-developed and well-nourished. No distress.  HENT:  Head: Normocephalic and atraumatic.  Nose:  Nose normal.  Mouth/Throat: Oropharynx is clear and moist. No oropharyngeal exudate.  Mouth exam: No lesions, sores, erythema, inflammation, abscess, cysts. Pain upon palpation to right lower mandible first molar region. No sublingual cyst/abscess.   Eyes: Conjunctivae and EOM are normal. Pupils are equal, round, and reactive to light. Right eye exhibits no discharge. Left eye exhibits no discharge.  Neck: Normal range of motion. Neck supple. No tracheal deviation present.  Cardiovascular: Normal rate, regular rhythm and normal heart sounds.   No murmur heard. Pulmonary/Chest: Effort normal and breath sounds normal. No respiratory distress. He has no wheezes. He has no rales.  Lymphadenopathy:    He has no cervical adenopathy.  Neurological: He is alert and oriented to person, place, and time. No cranial nerve deficit. He exhibits normal muscle tone. Coordination normal.  Skin: He is not diaphoretic.    ED Course  Procedures (including critical care time)  Labs Reviewed - No data to display No results found.   1. Pain, dental       MDM  DDx: Dental Infection Abscess  Patient is afebrile, normotensive, non-tachycardic, alert, happy affect. Patient is aseptic, not toxic appearing. No Ludwig's angina noted. Non-contributory physical exam. Etiology of dental pain unknown. Discharged patient with antibiotics and pain medication. Discussed with patient on how to take these medications - while on pain medications no driving, operating heavy machinery, using alcohol or drugs. Discussed with patient to follow-up with oral surgeon and dentist. Discussed with patient to ice right side of face for relief. Discussed with patient that if symptoms are to worsen to please report to the ED.  Patient agreed to plan of care, understood, all questions answered.        Raymon Mutton, PA-C 09/24/12 0214

## 2012-09-24 NOTE — ED Provider Notes (Signed)
Medical screening examination/treatment/procedure(s) were performed by non-physician practitioner and as supervising physician I was immediately available for consultation/collaboration.   Carleene Cooper III, MD 09/24/12 [phone]

## 2012-09-27 ENCOUNTER — Encounter: Payer: No Typology Code available for payment source | Admitting: Physical Medicine and Rehabilitation

## 2012-10-12 ENCOUNTER — Ambulatory Visit: Payer: No Typology Code available for payment source

## 2012-10-29 ENCOUNTER — Encounter (HOSPITAL_BASED_OUTPATIENT_CLINIC_OR_DEPARTMENT_OTHER): Payer: Self-pay | Admitting: *Deleted

## 2012-10-29 ENCOUNTER — Emergency Department (HOSPITAL_BASED_OUTPATIENT_CLINIC_OR_DEPARTMENT_OTHER): Payer: Self-pay

## 2012-10-29 ENCOUNTER — Emergency Department (HOSPITAL_BASED_OUTPATIENT_CLINIC_OR_DEPARTMENT_OTHER)
Admission: EM | Admit: 2012-10-29 | Discharge: 2012-10-29 | Disposition: A | Discharge status: Home / Self Care | Payer: Self-pay | Attending: Emergency Medicine | Admitting: Emergency Medicine

## 2012-10-29 DIAGNOSIS — Y9389 Activity, other specified: Secondary | ICD-10-CM | POA: Insufficient documentation

## 2012-10-29 DIAGNOSIS — IMO0002 Reserved for concepts with insufficient information to code with codable children: Secondary | ICD-10-CM | POA: Insufficient documentation

## 2012-10-29 DIAGNOSIS — R011 Cardiac murmur, unspecified: Secondary | ICD-10-CM | POA: Insufficient documentation

## 2012-10-29 DIAGNOSIS — R252 Cramp and spasm: Secondary | ICD-10-CM

## 2012-10-29 DIAGNOSIS — Z8739 Personal history of other diseases of the musculoskeletal system and connective tissue: Secondary | ICD-10-CM | POA: Insufficient documentation

## 2012-10-29 DIAGNOSIS — G8929 Other chronic pain: Secondary | ICD-10-CM | POA: Insufficient documentation

## 2012-10-29 DIAGNOSIS — X503XXA Overexertion from repetitive movements, initial encounter: Secondary | ICD-10-CM | POA: Insufficient documentation

## 2012-10-29 DIAGNOSIS — Z8679 Personal history of other diseases of the circulatory system: Secondary | ICD-10-CM | POA: Insufficient documentation

## 2012-10-29 DIAGNOSIS — T148XXA Other injury of unspecified body region, initial encounter: Secondary | ICD-10-CM

## 2012-10-29 DIAGNOSIS — Y929 Unspecified place or not applicable: Secondary | ICD-10-CM | POA: Insufficient documentation

## 2012-10-29 DIAGNOSIS — S0993XA Unspecified injury of face, initial encounter: Secondary | ICD-10-CM | POA: Insufficient documentation

## 2012-10-29 DIAGNOSIS — F17200 Nicotine dependence, unspecified, uncomplicated: Secondary | ICD-10-CM | POA: Insufficient documentation

## 2012-10-29 MED ORDER — NAPROXEN 500 MG PO TABS: 500.0000 mg | ORAL_TABLET | Freq: Two times a day (BID) | ORAL | Status: DC

## 2012-10-29 MED ORDER — METHOCARBAMOL 500 MG PO TABS: 500.0000 mg | ORAL_TABLET | Freq: Two times a day (BID) | ORAL | Status: DC

## 2012-10-29 MED ORDER — KETOROLAC TROMETHAMINE 60 MG/2ML IM SOLN
60.0000 mg | Freq: Once | INTRAMUSCULAR | Status: AC
  Administered 2012-10-29: 60 mg via INTRAMUSCULAR
  Filled 2012-10-29: qty 2

## 2012-10-29 NOTE — ED Provider Notes (Signed)
History     CSN: [id number]  Arrival date & time 10/29/12  1618   First MD Initiated Contact with Patient 10/29/12 1625      Chief Complaint  Patient presents with  . Back Injury    (Consider location/radiation/quality/duration/timing/severity/associated sxs/prior treatment) HPI Comments: Patient presents emergency department with chief complaint of upper back pain. He states that he has a history of chronic pain. He is no longer followed by pain clinic, as he lost his insurance. 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  He states that most of his pain is located in his right upper back.  He is tried taking tramadol with mild relief. He states the pain is worsened with arm movement. States his pain is moderate to severe.  The history is provided by the patient. No language interpreter was used.    Past Medical History  Diagnosis Date  . Heart murmur   . Herniated disc   . MVP (mitral valve prolapse)     Past Surgical History  Procedure Laterality Date  . Cholecystectomy    . Dental surgery      Family History  Problem Relation Age of Onset  . Hypertension Father   . Diabetes Neg Hx   . Heart attack Neg Hx   . Hyperlipidemia Neg Hx   . Sudden death Neg Hx     History  Substance Use Topics  . Smoking status: Current Every Day Smoker -- 1.00 packs/day    Types: Cigarettes  . Smokeless tobacco: Never Used  . Alcohol Use: 0.6 oz/week    1 Cans of beer per week     Comment: occasional       Review of Systems  All other systems reviewed and are negative.    Allergies  Review of patient's allergies indicates no known allergies.  Home Medications   Current Outpatient Rx  Name  Route  Sig  Dispense  Refill  . clindamycin (CLEOCIN) 150 MG capsule   Oral   Take 2 capsules (300 mg total) by mouth 3 (three) times daily.   60 capsule   0   . HYDROcodone-acetaminophen (NORCO) 5-325 MG per tablet   Oral   Take  1 tablet by mouth every 4 (four) hours as needed for pain.   6 tablet   0   . traMADol (ULTRAM) 50 MG tablet   Oral   Take 50 mg by mouth every 6 (six) hours as needed for pain.           BP 121/78  Pulse 88  Temp(Src) 98.1 F (36.7 C) (Oral)  Resp 18  Ht 5\' 11"  (1.803 m)  Wt 175 lb (79.379 kg)  BMI 24.42 kg/m2  SpO2 96%  Physical Exam  Nursing note and vitals reviewed. Constitutional: He is oriented to person, place, and time. He appears well-developed and well-nourished. No distress.  HENT:  Head: Normocephalic and atraumatic.  Eyes: Conjunctivae and EOM are normal. Right eye exhibits no discharge. Left eye exhibits no discharge. No scleral icterus.  Neck: Normal range of motion. Neck supple. No tracheal deviation present.  Cardiovascular: Normal rate, regular rhythm and normal heart sounds.  Exam reveals no gallop and no friction rub.   No murmur heard. Pulmonary/Chest: Effort normal and breath sounds normal. No respiratory distress. He has no wheezes.  Abdominal: Soft. He exhibits no distension. There is no tenderness.  Musculoskeletal: Normal range of motion.  Cervical paraspinal  muscles tender to palpation, no bony tenderness, step-offs, or gross abnormality or deformity of spine, patient is able to ambulate, moves all extremities, right sided upper trapezius muscle body tenderness palpation  Neurological: He is alert and oriented to person, place, and time.  Sensation and strength intact bilaterally  Skin: Skin is warm. He is not diaphoretic.  Psychiatric: He has a normal mood and affect. His behavior is normal. Judgment and thought content normal.    ED Course  Procedures (including critical care time)  Results for orders placed during the hospital encounter of 09/23/09  URINE CULTURE      Result Value Range   Specimen Description URINE, CLEAN CATCH     Special Requests NONE     Colony Count NO GROWTH     Culture NO GROWTH     Report Status 09/25/2009 FINAL     URINALYSIS, ROUTINE W REFLEX MICROSCOPIC      Result Value Range   Color, Urine YELLOW  YELLOW   APPearance CLOUDY (*) CLEAR   Specific Gravity, Urine 1.020  1.005 - 1.030   pH 7.0  5.0 - 8.0   Glucose, UA NEGATIVE  NEGATIVE mg/dL   Hgb urine dipstick NEGATIVE  NEGATIVE   Bilirubin Urine NEGATIVE  NEGATIVE   Ketones, ur NEGATIVE  NEGATIVE mg/dL   Protein, ur NEGATIVE  NEGATIVE mg/dL   Urobilinogen, UA 0.2  0.0 - 1.0 mg/dL   Nitrite NEGATIVE  NEGATIVE   Leukocytes, UA SMALL (*) NEGATIVE  URINE MICROSCOPIC-ADD ON      Result Value Range   Squamous Epithelial / LPF FEW (*) RARE   WBC, UA 3-6  <3 WBC/hpf   Bacteria, UA RARE  RARE   Dg Cervical Spine Complete  10/29/2012  *RADIOLOGY REPORT*  Clinical Data: Neck pain.  CERVICAL SPINE - COMPLETE 4+ VIEW  Comparison: MRI 04/18/2012  Findings: Normal alignment.  Loss of normal cervical lordosis.  No fracture.  Prevertebral soft tissues are normal.  Disc spaces are maintained.  IMPRESSION: Cervical straightening, stable since prior MRI.  No acute findings.   Original Report Authenticated By: Charlett Nose, M.D.       1. Muscle cramps   2. Muscle strain       MDM  Patient with back pain.  No neurological deficits and normal neuro exam.  Patient can walk but states is painful.  No loss of bowel or bladder control.  No concern for cauda equina.  No fever, night sweats, weight loss, h/o cancer, IVDU.  RICE protocol and pain medicine indicated and discussed with patient.          Roxy Horseman, PA-C 10/29/12 1738

## 2012-10-29 NOTE — ED Notes (Signed)
Pt states he injured his back Friday lifting [employer] machines. Hx of ruptured and herniated disc

## 2012-10-30 NOTE — ED Provider Notes (Signed)
Medical screening examination/treatment/procedure(s) were performed by non-physician practitioner and as supervising physician I was immediately available for consultation/collaboration.   Larkin Alfred, MD 10/30/12 2134 

## 2012-11-01 ENCOUNTER — Ambulatory Visit (INDEPENDENT_AMBULATORY_CARE_PROVIDER_SITE_OTHER): Payer: No Typology Code available for payment source | Admitting: Family Medicine

## 2012-11-01 ENCOUNTER — Encounter: Payer: Self-pay | Admitting: Family Medicine

## 2012-11-01 VITALS — BP 113/76 | HR 84 | Ht 71.0 in | Wt 175.0 lb

## 2012-11-01 DIAGNOSIS — S239XXA Sprain of unspecified parts of thorax, initial encounter: Secondary | ICD-10-CM

## 2012-11-01 DIAGNOSIS — S29019A Strain of muscle and tendon of unspecified wall of thorax, initial encounter: Secondary | ICD-10-CM

## 2012-11-01 NOTE — Patient Instructions (Addendum)
You have a thoracic strain. Take tylenol for baseline pain relief (1-2 extra strength tabs 3x/day) Ibuprofen 3-4 tabs three times a day for pain and inflammation. Tramadol as needed for severe pain (no driving on this medicine). Consider Flexeril as needed for muscle spasms (no driving on this medicine if it makes you sleepy). Stay as active as possible. Do home exercises and stretches as directed. Consider massage, chiropractor, physical therapy, and/or acupuncture. Physical therapy has been shown to be helpful while the others have mixed results. Strengthening of low back muscles, abdominal musculature are key for long term pain relief. Call me if not getting better and we'll order physical therapy. Otherwise follow up with me as needed.

## 2012-11-02 ENCOUNTER — Encounter: Payer: Self-pay | Admitting: Family Medicine

## 2012-11-02 DIAGNOSIS — S29019A Strain of muscle and tendon of unspecified wall of thorax, initial encounter: Secondary | ICD-10-CM | POA: Insufficient documentation

## 2012-11-02 NOTE — Progress Notes (Signed)
Subjective:    Patient ID: Justin Morgan, male    DOB: 04-17-1986, 27 y.o.   MRN: [id number]  PCP: Dr. Leonette Most  HPI  27 yo M here for upper back pain.  04/05/12: Patient reports about 5-6 years ago he had neck pain, was being seen at [facility] and [facility]. Reports he's had MRI, x-rays before. Did formal physical therapy, tried several different medications. Saw neurosurgeon(s) who recommended against surgical intervention based on MRI findings. Eventually recommended he go to a pain clinic. Overall never completely improved then about 6 weeks ago while lifting weights he thinks he irritated neck while doing shrugs then abruptly turning head to left side. Flared again on Friday while doing crunches with neck flexion. States pain is between shoulder blades and in neck, radiates to right side of upper back/shoulder area. No numbness/tingling. No bowel/bladder dysfunction.  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 No known injury at the time. Next morning felt pain in posterior right shoulder, upper back near shoulder blade. Has been improving some since then. Taking tramadol only. No ice/heat, nsaids, or tylenol. Not doing any specific exercises for this. Of note he was seen by Dr. Wynn Banker for his chronic neck pain.  He had both a narcotic and alcohol metabolite on UDS so was advised they would see him but no narcotics would be prescribed - he has not been back to see them.  Past Medical History  Diagnosis Date  . Heart murmur   . Herniated disc   . MVP (mitral valve prolapse)     Current Outpatient Prescriptions on File Prior to Visit  Medication Sig Dispense Refill  . methocarbamol (ROBAXIN) 500 MG tablet Take 1 tablet (500 mg total) by mouth 2 (two) times daily.  20 tablet  0  . naproxen (NAPROSYN) 500 MG tablet Take 1 tablet (500 mg total) by mouth 2 (two) times daily with a meal.  30 tablet  0  . traMADol  (ULTRAM) 50 MG tablet Take 50 mg by mouth every 6 (six) hours as needed for pain.       No current facility-administered medications on file prior to visit.    Past Surgical History  Procedure Laterality Date  . Cholecystectomy    . Dental surgery      No Known Allergies  History   Social History  . Marital Status: Single    Spouse Name: N/A    Number of Children: N/A  . Years of Education: N/A   Occupational History  . Not on file.   Social History Main Topics  . Smoking status: Current Every Day Smoker -- 1.00 packs/day    Types: Cigarettes  . Smokeless tobacco: Never Used  . Alcohol Use: 0.6 oz/week    1 Cans of beer per week     Comment: occasional   . Drug Use: No  . Sexually Active: Not on file   Other Topics Concern  . Not on file   Social History Narrative  . No narrative on file    Family History  Problem Relation Age of Onset  . Hypertension Father   . Diabetes Neg Hx   . Heart attack Neg Hx   . Hyperlipidemia Neg Hx   . Sudden death Neg Hx     BP 113/76  Pulse 84  Ht 5\' 11"  (1.803 m)  Wt 175 lb (79.379 kg)  BMI 24.42 kg/m2  Review of Systems  See HPI above.  Objective:   Physical Exam  Gen: NAD  Neck/Upper back: No gross deformity, swelling, bruising.  Minimal TTP right upper thoracic paraspinal muscles. No midline, bone TTP. FROM neck - pain with full flexion. BUE strength 5/5.   Sensation intact to light touch.   1+ equal reflexes in triceps, biceps, brachioradialis tendons. Negative spurlings. NV intact distal BUEs.    Assessment & Plan:  1. Thoracic strain - reassured.  Tylenol, ibuprofen, tramadol.  Start home exercise program.  Declined formal PT.  Would not prescribe anything stronger than tramadol for this.  If not improving would add PT.  Otherwise f/u prn.

## 2012-11-02 NOTE — Assessment & Plan Note (Signed)
reassured.  Tylenol, ibuprofen, tramadol.  Start home exercise program.  Declined formal PT.  Would not prescribe anything stronger than tramadol for this.  If not improving would add PT.  Otherwise f/u prn.

## 2012-12-29 ENCOUNTER — Emergency Department (HOSPITAL_BASED_OUTPATIENT_CLINIC_OR_DEPARTMENT_OTHER)
Admission: EM | Admit: 2012-12-29 | Discharge: 2012-12-29 | Disposition: A | Discharge status: Home / Self Care | Payer: Self-pay | Attending: Emergency Medicine | Admitting: Emergency Medicine

## 2012-12-29 ENCOUNTER — Encounter (HOSPITAL_BASED_OUTPATIENT_CLINIC_OR_DEPARTMENT_OTHER): Payer: Self-pay | Admitting: *Deleted

## 2012-12-29 ENCOUNTER — Emergency Department (HOSPITAL_BASED_OUTPATIENT_CLINIC_OR_DEPARTMENT_OTHER): Payer: Self-pay

## 2012-12-29 DIAGNOSIS — R011 Cardiac murmur, unspecified: Secondary | ICD-10-CM | POA: Insufficient documentation

## 2012-12-29 DIAGNOSIS — Y929 Unspecified place or not applicable: Secondary | ICD-10-CM | POA: Insufficient documentation

## 2012-12-29 DIAGNOSIS — Z8739 Personal history of other diseases of the musculoskeletal system and connective tissue: Secondary | ICD-10-CM | POA: Insufficient documentation

## 2012-12-29 DIAGNOSIS — Z8679 Personal history of other diseases of the circulatory system: Secondary | ICD-10-CM | POA: Insufficient documentation

## 2012-12-29 DIAGNOSIS — F17200 Nicotine dependence, unspecified, uncomplicated: Secondary | ICD-10-CM | POA: Insufficient documentation

## 2012-12-29 DIAGNOSIS — S46911A Strain of unspecified muscle, fascia and tendon at shoulder and upper arm level, right arm, initial encounter: Secondary | ICD-10-CM

## 2012-12-29 DIAGNOSIS — X503XXA Overexertion from repetitive movements, initial encounter: Secondary | ICD-10-CM | POA: Insufficient documentation

## 2012-12-29 DIAGNOSIS — Y9389 Activity, other specified: Secondary | ICD-10-CM | POA: Insufficient documentation

## 2012-12-29 DIAGNOSIS — IMO0002 Reserved for concepts with insufficient information to code with codable children: Secondary | ICD-10-CM | POA: Insufficient documentation

## 2012-12-29 DIAGNOSIS — X58XXXA Exposure to other specified factors, initial encounter: Secondary | ICD-10-CM | POA: Insufficient documentation

## 2012-12-29 NOTE — ED Notes (Signed)
right shoulder pain. 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 No specific injury.

## 2012-12-29 NOTE — ED Provider Notes (Signed)
History    CSN: [id number] Arrival date & time 12/29/12  1708  First MD Initiated Contact with Patient 12/29/12 1715     Chief Complaint  Patient presents with  . Shoulder Pain   (Consider location/radiation/quality/duration/timing/severity/associated sxs/prior Treatment) HPI Comments: Patient presents with pain in the right shoulder that he tells me started while moving heavy objects several days ago.  He denies numbness, tingling, or weakness.    Patient is a 27 y.o. male presenting with shoulder pain. The history is provided by the patient.  Shoulder Pain This is a new problem. Episode onset: 3 days ago. The problem occurs constantly. The problem has not changed since onset.Exacerbated by: movement, palpation. Relieved by: tramadol. Treatments tried: tramadol. The treatment provided moderate relief.   Past Medical History  Diagnosis Date  . Heart murmur   . Herniated disc   . MVP (mitral valve prolapse)    Past Surgical History  Procedure Laterality Date  . Cholecystectomy    . Dental surgery     Family History  Problem Relation Age of Onset  . Hypertension Father   . Diabetes Neg Hx   . Heart attack Neg Hx   . Hyperlipidemia Neg Hx   . Sudden death Neg Hx    History  Substance Use Topics  . Smoking status: Current Every Day Smoker -- 1.00 packs/day    Types: Cigarettes  . Smokeless tobacco: Never Used  . Alcohol Use: 0.6 oz/week    1 Cans of beer per week     Comment: occasional     Review of Systems  All other systems reviewed and are negative.    Allergies  Review of patient's allergies indicates no known allergies.  Home Medications   Current Outpatient Rx  Name  Route  Sig  Dispense  Refill  . methocarbamol (ROBAXIN) 500 MG tablet   Oral   Take 1 tablet (500 mg total) by mouth 2 (two) times daily.   20 tablet   0   . naproxen (NAPROSYN) 500 MG tablet   Oral   Take 1 tablet (500 mg total) by mouth 2 (two) times daily with a meal.   30  tablet   0   . traMADol (ULTRAM) 50 MG tablet   Oral   Take 50 mg by mouth every 6 (six) hours as needed for pain.          BP 122/71  Pulse 70  Temp(Src) 98.1 F (36.7 C) (Oral)  Resp 20  Ht 5\' 11"  (1.803 m)  Wt 175 lb (79.379 kg)  BMI 24.42 kg/m2  SpO2 99% Physical Exam  Nursing note and vitals reviewed. Constitutional: He is oriented to person, place, and time. He appears well-developed and well-nourished. No distress.  HENT:  Head: Normocephalic and atraumatic.  Mouth/Throat: Oropharynx is clear and moist.  Neck: Normal range of motion. Neck supple.  Musculoskeletal:  There it ttp to the top of the right shoulder in the area of the ac joint.  There is no deformity and he seems to have good range of motion without much limitation.  The ulnar and radial pulses are intact and sensation and motor are intact to the right arm.  Neurological: He is alert and oriented to person, place, and time.  Skin: Skin is warm and dry. He is not diaphoretic.    ED Course  Procedures (including critical care time) Labs Reviewed - No data to display No results found. No diagnosis found.   MDM  The patient presents with complaints of pain in the shoulder since moving heavy objects at work.  The xrays are negative and the exam is unimpressive.  He will be discharged with nsaids, rest, follow up prn.  Geoffery Lyons, MD 12/29/12 [phone]

## 2013-06-20 ENCOUNTER — Encounter (HOSPITAL_BASED_OUTPATIENT_CLINIC_OR_DEPARTMENT_OTHER): Payer: Self-pay | Admitting: Emergency Medicine

## 2013-06-20 ENCOUNTER — Emergency Department (HOSPITAL_BASED_OUTPATIENT_CLINIC_OR_DEPARTMENT_OTHER)
Admission: EM | Admit: 2013-06-20 | Discharge: 2013-06-20 | Disposition: A | Discharge status: Home / Self Care | Payer: No Typology Code available for payment source | Attending: Emergency Medicine | Admitting: Emergency Medicine

## 2013-06-20 DIAGNOSIS — Z8739 Personal history of other diseases of the musculoskeletal system and connective tissue: Secondary | ICD-10-CM | POA: Insufficient documentation

## 2013-06-20 DIAGNOSIS — Z791 Long term (current) use of non-steroidal anti-inflammatories (NSAID): Secondary | ICD-10-CM | POA: Insufficient documentation

## 2013-06-20 DIAGNOSIS — S61512A Laceration without foreign body of left wrist, initial encounter: Secondary | ICD-10-CM

## 2013-06-20 DIAGNOSIS — S61509A Unspecified open wound of unspecified wrist, initial encounter: Secondary | ICD-10-CM | POA: Insufficient documentation

## 2013-06-20 DIAGNOSIS — F17200 Nicotine dependence, unspecified, uncomplicated: Secondary | ICD-10-CM | POA: Insufficient documentation

## 2013-06-20 DIAGNOSIS — R011 Cardiac murmur, unspecified: Secondary | ICD-10-CM | POA: Insufficient documentation

## 2013-06-20 DIAGNOSIS — Z79899 Other long term (current) drug therapy: Secondary | ICD-10-CM | POA: Insufficient documentation

## 2013-06-20 DIAGNOSIS — Z8679 Personal history of other diseases of the circulatory system: Secondary | ICD-10-CM | POA: Insufficient documentation

## 2013-06-20 NOTE — ED Notes (Signed)
Pt cut w box cutter to left wrist  approx 2 inch lac,  Bleeding controlled
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
## 2013-06-28 ENCOUNTER — Emergency Department (HOSPITAL_BASED_OUTPATIENT_CLINIC_OR_DEPARTMENT_OTHER)
Admission: EM | Admit: 2013-06-28 | Discharge: 2013-06-28 | Disposition: A | Discharge status: Home / Self Care | Payer: Self-pay | Attending: Emergency Medicine | Admitting: Emergency Medicine

## 2013-06-28 ENCOUNTER — Encounter (HOSPITAL_BASED_OUTPATIENT_CLINIC_OR_DEPARTMENT_OTHER): Payer: Self-pay | Admitting: Emergency Medicine

## 2013-06-28 ENCOUNTER — Emergency Department (HOSPITAL_BASED_OUTPATIENT_CLINIC_OR_DEPARTMENT_OTHER): Payer: Self-pay

## 2013-06-28 DIAGNOSIS — S20219A Contusion of unspecified front wall of thorax, initial encounter: Secondary | ICD-10-CM | POA: Insufficient documentation

## 2013-06-28 DIAGNOSIS — F17200 Nicotine dependence, unspecified, uncomplicated: Secondary | ICD-10-CM | POA: Insufficient documentation

## 2013-06-28 DIAGNOSIS — Z79899 Other long term (current) drug therapy: Secondary | ICD-10-CM | POA: Insufficient documentation

## 2013-06-28 DIAGNOSIS — R011 Cardiac murmur, unspecified: Secondary | ICD-10-CM | POA: Insufficient documentation

## 2013-06-28 DIAGNOSIS — Z791 Long term (current) use of non-steroidal anti-inflammatories (NSAID): Secondary | ICD-10-CM | POA: Insufficient documentation

## 2013-06-28 DIAGNOSIS — S20212A Contusion of left front wall of thorax, initial encounter: Secondary | ICD-10-CM

## 2013-06-28 MED ORDER — KETOROLAC TROMETHAMINE 10 MG PO TABS
10.0000 mg | ORAL_TABLET | Freq: Once | ORAL | Status: AC
  Administered 2013-06-28: 10 mg via ORAL

## 2013-06-28 MED ORDER — MELOXICAM 7.5 MG PO TABS: 7.5000 mg | ORAL_TABLET | Freq: Every day | ORAL | Status: DC

## 2013-06-28 MED ORDER — KETOROLAC TROMETHAMINE 10 MG PO TABS
ORAL_TABLET | ORAL | Status: AC
  Administered 2013-06-28: 10 mg via ORAL
  Filled 2013-06-28: qty 1

## 2013-06-28 MED ORDER — DEXAMETHASONE 4 MG PO TABS
ORAL_TABLET | ORAL | Status: DC
Start: 2013-06-28 — End: 2013-06-28
  Filled 2013-06-28: qty 2

## 2013-06-28 MED ORDER — METHOCARBAMOL 500 MG PO TABS
1000.0000 mg | ORAL_TABLET | Freq: Once | ORAL | Status: DC
  Filled 2013-06-28: qty 2

## 2013-06-28 MED ORDER — KETOROLAC TROMETHAMINE 60 MG/2ML IM SOLN
60.0000 mg | Freq: Once | INTRAMUSCULAR | Status: DC
  Filled 2013-06-28: qty 2

## 2013-06-28 MED ORDER — DEXAMETHASONE 4 MG PO TABS: 6.0000 mg | ORAL_TABLET | Freq: Once | ORAL | Status: DC

## 2013-06-28 MED ORDER — METHOCARBAMOL 500 MG PO TABS: 500.0000 mg | ORAL_TABLET | Freq: Three times a day (TID) | ORAL | Status: DC

## 2013-06-28 MED ORDER — TRAMADOL HCL 50 MG PO TABS
50.0000 mg | ORAL_TABLET | Freq: Once | ORAL | Status: AC
  Administered 2013-06-28: 50 mg via ORAL
  Filled 2013-06-28: qty 1
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
## 2013-06-28 NOTE — Discharge Instructions (Signed)
Chest Contusion °A chest contusion is a deep bruise on your chest area. Contusions are the result of an injury that caused bleeding under the skin. A chest contusion may involve bruising of the skin, muscles, or ribs. The contusion may turn blue, purple, or yellow. Minor injuries will give you a painless contusion, but more severe contusions may stay painful and swollen for a few weeks. °CAUSES  °A contusion is usually caused by a blow, trauma, or direct force to an area of the body. °SYMPTOMS  °· Swelling and redness of the injured area. °· Discoloration of the injured area. °· Tenderness and soreness of the injured area. °· Pain. °DIAGNOSIS  °The diagnosis can be made by taking a history and performing a physical exam. An X-ray, CT scan, or MRI may be needed to determine if there were any associated injuries, such as broken bones (fractures) or internal injuries. °TREATMENT  °Often, the best treatment for a chest contusion is resting, icing, and applying cold compresses to the injured area. Deep breathing exercises may be recommended to reduce the risk of pneumonia. Over-the-counter medicines may also be recommended for pain control. °HOME CARE INSTRUCTIONS  °· Put ice on the injured area. °· Put ice in a plastic bag. °· Place a towel between your skin and the bag. °· Leave the ice on for 15-20 minutes, 03-04 times a day. °· Only take over-the-counter or prescription medicines as directed by your caregiver. Your caregiver may recommend avoiding anti-inflammatory medicines (aspirin, ibuprofen, and naproxen) for 48 hours because these medicines may increase bruising. °· Rest the injured area. °· Perform deep-breathing exercises as directed by your caregiver. °· Stop smoking if you smoke. °· Do not lift objects over 5 pounds (2.3 kg) for 3 days or longer if recommended by your caregiver. °SEEK IMMEDIATE MEDICAL CARE IF:  °· You have increased bruising or swelling. °· You have pain that is getting worse. °· You have  difficulty breathing. °· You have dizziness, weakness, or fainting. °· You have blood in your urine or stool. °· You cough up or vomit blood. °· Your swelling or pain is not relieved with medicines. °MAKE SURE YOU:  °· Understand these instructions. °· Will watch your condition. °· Will get help right away if you are not doing well or get worse. °Document Released: 03/09/2001 Document Revised: 03/08/2012 Document Reviewed: 12/06/2011 °[employer]® Patient Information ©2014 [employer]. ° °

## 2013-06-28 NOTE — Patient Instructions (Signed)
Instructed patient on the proper technique for using an IS. Patient able to achieve X 5. Patient  instructed to use IS 10 times per hour. Patient tolerated well
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
## 2013-06-28 NOTE — ED Notes (Signed)
MD at bedside. 

## 2014-06-13 ENCOUNTER — Encounter (HOSPITAL_BASED_OUTPATIENT_CLINIC_OR_DEPARTMENT_OTHER): Payer: Self-pay | Admitting: *Deleted

## 2014-06-13 ENCOUNTER — Emergency Department (HOSPITAL_BASED_OUTPATIENT_CLINIC_OR_DEPARTMENT_OTHER)
Admission: EM | Admit: 2014-06-13 | Discharge: 2014-06-13 | Disposition: A | Discharge status: Home / Self Care | Payer: No Typology Code available for payment source | Attending: Emergency Medicine | Admitting: Emergency Medicine

## 2014-06-13 DIAGNOSIS — Z791 Long term (current) use of non-steroidal anti-inflammatories (NSAID): Secondary | ICD-10-CM | POA: Insufficient documentation

## 2014-06-13 DIAGNOSIS — Z8679 Personal history of other diseases of the circulatory system: Secondary | ICD-10-CM | POA: Insufficient documentation

## 2014-06-13 DIAGNOSIS — N4889 Other specified disorders of penis: Secondary | ICD-10-CM | POA: Insufficient documentation

## 2014-06-13 DIAGNOSIS — Z720 Tobacco use: Secondary | ICD-10-CM | POA: Insufficient documentation

## 2014-06-13 DIAGNOSIS — R011 Cardiac murmur, unspecified: Secondary | ICD-10-CM | POA: Insufficient documentation

## 2014-06-13 DIAGNOSIS — L089 Local infection of the skin and subcutaneous tissue, unspecified: Secondary | ICD-10-CM | POA: Insufficient documentation

## 2014-06-13 DIAGNOSIS — Z8739 Personal history of other diseases of the musculoskeletal system and connective tissue: Secondary | ICD-10-CM | POA: Insufficient documentation

## 2014-06-13 MED ORDER — NAPROXEN 500 MG PO TABS: 500.0000 mg | ORAL_TABLET | Freq: Two times a day (BID) | ORAL | Status: DC

## 2014-06-13 MED ORDER — SULFAMETHOXAZOLE-TRIMETHOPRIM 800-160 MG PO TABS: 1.0000 | ORAL_TABLET | Freq: Two times a day (BID) | ORAL | Status: AC

## 2014-06-13 NOTE — ED Provider Notes (Signed)
CSN: [id number]     Arrival date & time 06/13/14  1927 History   First MD Initiated Contact with Patient 06/13/14 2005     Chief Complaint  Patient presents with  . Groin Pain     (Consider location/radiation/quality/duration/timing/severity/associated sxs/prior Treatment) HPI Comments: Patient presents with complaint of a red bump on the ventral aspect of his penis. Patient noted a ingrown hair in that area several days ago which he states he removed. Area improved however yesterday he noted a small bump in the same area which he squeezed and expressed some blood and pus. Today it was more sore and tender. No active drainage. No dysuria. No penile discharge. Patient has been in a monogamous relationship for 1-1/2 years. He does not feel that he is at high risk for sexually transmitted infection.  Patient is a 28 y.o. male presenting with groin pain. The history is provided by the patient.  Groin Pain Pertinent negatives include no fever, nausea or vomiting.    Past Medical History  Diagnosis Date  . Heart murmur   . Herniated disc   . MVP (mitral valve prolapse)    Past Surgical History  Procedure Laterality Date  . Cholecystectomy    . Dental surgery     Family History  Problem Relation Age of Onset  . Hypertension Father   . Diabetes Neg Hx   . Heart attack Neg Hx   . Hyperlipidemia Neg Hx   . Sudden death Neg Hx    History  Substance Use Topics  . Smoking status: Current Every Day Smoker -- 1.00 packs/day    Types: Cigarettes  . Smokeless tobacco: Never Used  . Alcohol Use: 0.6 oz/week    1 Cans of beer per week     Comment: occasional     Review of Systems  Constitutional: Negative for fever.  Gastrointestinal: Negative for nausea and vomiting.  Skin: Positive for wound. Negative for color change.       Positive for pustule  Hematological: Negative for adenopathy.      Allergies  Review of patient's allergies indicates no known allergies.  Home  Medications   Prior to Admission medications   Medication Sig Start Date End Date Taking? Authorizing Provider  meloxicam (MOBIC) 7.5 MG tablet Take 1 tablet (7.5 mg total) by mouth daily. 06/28/13   April K Palumbo-Rasch, MD  methocarbamol (ROBAXIN) 500 MG tablet Take 1 tablet (500 mg total) by mouth 2 (two) times daily. 10/29/12   Roxy Horsemanobert Browning, PA-C  methocarbamol (ROBAXIN) 500 MG tablet Take 1 tablet (500 mg total) by mouth 3 (three) times daily. 06/28/13   April K Palumbo-Rasch, MD  naproxen (NAPROSYN) 500 MG tablet Take 1 tablet (500 mg total) by mouth 2 (two) times daily. 06/13/14   Renne CriglerJoshua Giankarlo Leamer, PA-C  sulfamethoxazole-trimethoprim (BACTRIM DS,SEPTRA DS) 800-160 MG per tablet Take 1 tablet by mouth 2 (two) times daily. 06/13/14 06/20/14  Renne CriglerJoshua Lasha Echeverria, PA-C  traMADol (ULTRAM) 50 MG tablet Take 50 mg by mouth every 6 (six) hours as needed for pain.    Historical Provider, MD   BP 125/77 mmHg  Pulse 88  Temp(Src) 98.2 F (36.8 C) (Oral)  Resp 18  Ht 5\' 11"  (1.803 m)  Wt 170 lb (77.111 kg)  BMI 23.72 kg/m2  SpO2 99% Physical Exam  Constitutional: He appears well-developed and well-nourished.  HENT:  Head: Normocephalic and atraumatic.  Eyes: Conjunctivae are normal.  Neck: Normal range of motion. Neck supple.  Pulmonary/Chest: No respiratory distress.  Genitourinary: Circumcised. Penile erythema and penile tenderness present. No discharge found.  There is a small papule noted on the ventral aspect of the penis without surrounding cellulitis.   Neurological: He is alert.  Skin: Skin is warm and dry.  Psychiatric: He has a normal mood and affect.  Nursing note and vitals reviewed.   ED Course  Procedures (including critical care time) Labs Review Labs Reviewed - No data to display  Imaging Review No results found.   EKG Interpretation None       8:24 PM Patient seen and examined.   Vital signs reviewed and are as follows: BP 125/77 mmHg  Pulse 88  Temp(Src) 98.2 F  (36.8 C) (Oral)  Resp 18  Ht 5\' 11"  (1.803 m)  Wt 170 lb (77.111 kg)  BMI 23.72 kg/m2  SpO2 99%  Patient counseled on wound care. Given location, will give oral Bactrim to help prevent spread of infection. No cellulitis at this time.  The patient was urged to return to the Emergency Department urgently with worsening pain, swelling, expanding erythema especially if it streaks away from the affected area, fever, or if they have any other concerns.   The patient was urged to return to the Emergency Department or go to their PCP in 48 hours for wound recheck if the area is not significantly improved.  The patient verbalized understanding and stated agreement with this plan.      MDM   Final diagnoses:  Pustule   Patient with minor skin infection. No drainable abscess. No immunocompromise. Do not suspect HSV. No other concerning findings on exam. Oral abx given due to location.     Renne CriglerJoshua Narcissus Detwiler, PA-C 06/13/14 2029  Richardean Canalavid H Yao, MD 06/16/14 [phone]

## 2014-06-13 NOTE — ED Notes (Signed)
States he has a pimple on his penis with soreness and redness.

## 2014-06-13 NOTE — Discharge Instructions (Signed)
Please read and follow all provided instructions.  Your diagnoses today include:  1. Pustule     Tests performed today include:  Vital signs. See below for your results today.   Medications prescribed:   Bactrim (trimethoprim/sulfamethoxazole) - antibiotic  You have been prescribed an antibiotic medicine: take the entire course of medicine even if you are feeling better. Stopping early can cause the antibiotic not to work.   Naproxen - anti-inflammatory pain medication  Do not exceed 500mg  naproxen every 12 hours, take with food  You have been prescribed an anti-inflammatory medication or NSAID. Take with food. Take smallest effective dose for the shortest duration needed for your pain. Stop taking if you experience stomach pain or vomiting.   Take any prescribed medications only as directed.   Home care instructions:   Follow any educational materials contained in this packet  Follow-up instructions: Return to the Emergency Department in 48 hours for a recheck if your symptoms are not significantly improved.  Return instructions:  Return to the Emergency Department if you have:  Fever  Worsening symptoms  Worsening pain  Worsening swelling  Redness of the skin that moves away from the affected area, especially if it streaks away from the affected area   Any other emergent concerns  Your vital signs today were: BP 125/77 mmHg   Pulse 88   Temp(Src) 98.2 F (36.8 C) (Oral)   Resp 18   Ht 5\' 11"  (1.803 m)   Wt 170 lb (77.111 kg)   BMI 23.72 kg/m2   SpO2 99% If your blood pressure (BP) was elevated above 135/85 this visit, please have this repeated by your doctor within one month. --------------

## 2014-09-25 ENCOUNTER — Emergency Department (HOSPITAL_BASED_OUTPATIENT_CLINIC_OR_DEPARTMENT_OTHER)
Admission: EM | Admit: 2014-09-25 | Discharge: 2014-09-25 | Disposition: A | Discharge status: Home / Self Care | Payer: No Typology Code available for payment source | Attending: Emergency Medicine | Admitting: Emergency Medicine

## 2014-09-25 ENCOUNTER — Encounter (HOSPITAL_BASED_OUTPATIENT_CLINIC_OR_DEPARTMENT_OTHER): Payer: Self-pay | Admitting: *Deleted

## 2014-09-25 DIAGNOSIS — X58XXXA Exposure to other specified factors, initial encounter: Secondary | ICD-10-CM | POA: Insufficient documentation

## 2014-09-25 DIAGNOSIS — Z720 Tobacco use: Secondary | ICD-10-CM | POA: Insufficient documentation

## 2014-09-25 DIAGNOSIS — Y9289 Other specified places as the place of occurrence of the external cause: Secondary | ICD-10-CM | POA: Insufficient documentation

## 2014-09-25 DIAGNOSIS — Z8679 Personal history of other diseases of the circulatory system: Secondary | ICD-10-CM | POA: Insufficient documentation

## 2014-09-25 DIAGNOSIS — Z791 Long term (current) use of non-steroidal anti-inflammatories (NSAID): Secondary | ICD-10-CM | POA: Insufficient documentation

## 2014-09-25 DIAGNOSIS — Z79899 Other long term (current) drug therapy: Secondary | ICD-10-CM | POA: Insufficient documentation

## 2014-09-25 DIAGNOSIS — S0502XA Injury of conjunctiva and corneal abrasion without foreign body, left eye, initial encounter: Secondary | ICD-10-CM | POA: Insufficient documentation

## 2014-09-25 DIAGNOSIS — Z8739 Personal history of other diseases of the musculoskeletal system and connective tissue: Secondary | ICD-10-CM | POA: Insufficient documentation

## 2014-09-25 DIAGNOSIS — Y998 Other external cause status: Secondary | ICD-10-CM | POA: Insufficient documentation

## 2014-09-25 DIAGNOSIS — R011 Cardiac murmur, unspecified: Secondary | ICD-10-CM | POA: Insufficient documentation

## 2014-09-25 DIAGNOSIS — Y9389 Activity, other specified: Secondary | ICD-10-CM | POA: Insufficient documentation

## 2014-09-25 DIAGNOSIS — S0501XA Injury of conjunctiva and corneal abrasion without foreign body, right eye, initial encounter: Secondary | ICD-10-CM | POA: Insufficient documentation

## 2014-09-25 DIAGNOSIS — Y939 Activity, unspecified: Secondary | ICD-10-CM | POA: Insufficient documentation

## 2014-09-25 MED ORDER — NAPROXEN 500 MG PO TABS: 500.0000 mg | ORAL_TABLET | Freq: Two times a day (BID) | ORAL | Status: DC

## 2014-09-25 MED ORDER — ERYTHROMYCIN 5 MG/GM OP OINT: TOPICAL_OINTMENT | OPHTHALMIC | Status: DC

## 2014-09-25 MED ORDER — FLUORESCEIN SODIUM 1 MG OP STRP
ORAL_STRIP | OPHTHALMIC | Status: AC
  Administered 2014-09-25: 08:00:00
  Filled 2014-09-25: qty 1

## 2014-09-25 MED ORDER — HYDROCODONE-ACETAMINOPHEN 5-325 MG PO TABS: 1.0000 | ORAL_TABLET | Freq: Four times a day (QID) | ORAL | Status: DC | PRN

## 2014-09-25 MED ORDER — HYDROCODONE-ACETAMINOPHEN 5-325 MG PO TABS
1.0000 | ORAL_TABLET | Freq: Once | ORAL | Status: AC
  Administered 2014-09-25: 1 via ORAL
  Filled 2014-09-25: qty 1

## 2014-09-25 MED ORDER — TETRACAINE HCL 0.5 % OP SOLN
OPHTHALMIC | Status: AC
  Administered 2014-09-25: 08:00:00
  Filled 2014-09-25: qty 2

## 2014-09-25 NOTE — ED Notes (Signed)
States he was chopping wood yesterday and debris flew in both eyes.  No initial pain, however reports irritation.  Flushed both eyes with water but still has irritation.

## 2014-09-25 NOTE — ED Notes (Signed)
Pt sts he was cutting wood yesterday and got debris in his eyes. Pt sts bilat eye pain since then.

## 2014-09-25 NOTE — ED Provider Notes (Signed)
CSN: [id number]     Arrival date & time 09/25/14  0807 History   First MD Initiated Contact with Patient 09/25/14 [phone]     Chief Complaint  Patient presents with  . Eye Pain     (Consider location/radiation/quality/duration/timing/severity/associated sxs/prior Treatment) Patient is a 29 y.o. male presenting with eye pain. The history is provided by the patient.  Eye Pain Pertinent negatives include no chest pain, no abdominal pain, no headaches and no shortness of breath.   patient works as a Copywriter, advertisingwood cutter. Patient got to breeze in both eyes yesterday. Over the afternoon and evening irritation in both eyes continued to increase. Now pain is about 8 out of 10. Loss of sensitivity to light. Difficult for patient open his eyes. Patient did flush both eyes with water. Patient does not wear contacts. Patient states tetanus is up-to-date.  Past Medical History  Diagnosis Date  . Heart murmur   . Herniated disc   . MVP (mitral valve prolapse)    Past Surgical History  Procedure Laterality Date  . Cholecystectomy    . Dental surgery     Family History  Problem Relation Age of Onset  . Hypertension Father   . Diabetes Neg Hx   . Heart attack Neg Hx   . Hyperlipidemia Neg Hx   . Sudden death Neg Hx    History  Substance Use Topics  . Smoking status: Current Every Day Smoker -- 1.00 packs/day    Types: Cigarettes  . Smokeless tobacco: Never Used  . Alcohol Use: 0.6 oz/week    1 Cans of beer per week     Comment: occasional     Review of Systems  Constitutional: Negative for fever.  HENT: Negative for congestion.   Eyes: Positive for photophobia, pain and redness.  Respiratory: Negative for shortness of breath.   Cardiovascular: Negative for chest pain.  Gastrointestinal: Negative for abdominal pain.  Genitourinary: Negative for dysuria.  Musculoskeletal: Negative for back pain.  Skin: Negative for rash.  Neurological: Negative for headaches.  Hematological: Does not  bruise/bleed easily.  Psychiatric/Behavioral: Negative for confusion.      Allergies  Review of patient's allergies indicates no known allergies.  Home Medications   Prior to Admission medications   Medication Sig Start Date End Date Taking? Authorizing Provider  erythromycin ophthalmic ointment Place a 1/2 inch ribbon of ointment into the lower eyelid.Both eyes. 09/25/14   Vanetta MuldersScott Khoi Hamberger, MD  HYDROcodone-acetaminophen (NORCO/VICODIN) 5-325 MG per tablet Take 1-2 tablets by mouth every 6 (six) hours as needed for moderate pain. 09/25/14   Vanetta MuldersScott Lorenna Lurry, MD  meloxicam (MOBIC) 7.5 MG tablet Take 1 tablet (7.5 mg total) by mouth daily. 06/28/13   April Palumbo, MD  methocarbamol (ROBAXIN) 500 MG tablet Take 1 tablet (500 mg total) by mouth 2 (two) times daily. 10/29/12   Roxy Horsemanobert Browning, PA-C  methocarbamol (ROBAXIN) 500 MG tablet Take 1 tablet (500 mg total) by mouth 3 (three) times daily. 06/28/13   April Palumbo, MD  naproxen (NAPROSYN) 500 MG tablet Take 1 tablet (500 mg total) by mouth 2 (two) times daily. 06/13/14   Renne CriglerJoshua Geiple, PA-C  naproxen (NAPROSYN) 500 MG tablet Take 1 tablet (500 mg total) by mouth 2 (two) times daily. 09/25/14   Vanetta MuldersScott Foday Cone, MD  traMADol (ULTRAM) 50 MG tablet Take 50 mg by mouth every 6 (six) hours as needed for pain.    Historical Provider, MD   BP 115/65 mmHg  Pulse 74  Temp(Src) 97.8 F (36.6 C) (  Oral)  Resp 18  Ht  (1.803 m)  Wt 165 lb (74.844 kg)  BMI 23.02 kg/m2  SpO2 100% Physical Exam  Constitutional: He is oriented to person, place, and time. He appears well-developed and well-nourished. No distress.  HENT:  Head: Normocephalic.  Mouth/Throat: Oropharynx is clear and moist.  Eyes: EOM are normal. Pupils are equal, round, and reactive to light.  Patient treated with tetracaine and fluoroscopy seen staining. Right eye with the corneal abrasion at the 2:00 position. Left eye with corneal abrasion at the 9:00 position. No evidence of any  retained foreign body. Each of the respirations are relatively deep but no evidence of ulceration.  Neck: Normal range of motion. Neck supple.  Cardiovascular: Normal rate, regular rhythm and normal heart sounds.   No murmur heard. Pulmonary/Chest: Effort normal and breath sounds normal. No respiratory distress.  Abdominal: Soft. Bowel sounds are normal. There is no tenderness.  Neurological: He is alert and oriented to person, place, and time. No cranial nerve deficit. He exhibits normal muscle tone. Coordination normal.  Nursing note and vitals reviewed.   ED Course  Procedures (including critical care time) Labs Review Labs Reviewed - No data to display  Imaging Review No results found.   EKG Interpretation None      MDM   Final diagnoses:  Bilateral corneal abrasions, initial encounter    Bilateral corneal abrasions 1 in each eye relatively deep abrasions. No evidence of any ulceration. No evidence of any retained foreign body. Will be treated with erythromycin ophthalmic ointment anti-inflammatory medicine and pain medicine. Follow-up with ophthalmology is important and referral provided.  Patient states tetanus is up-to-date.  Vanetta Mulders, MD 09/25/14 [phone]

## 2014-09-25 NOTE — Discharge Instructions (Signed)
Corneal Abrasion The cornea is the clear covering at the front and center of the eye. When looking at the colored portion of the eye (iris), you are looking through the cornea. This very thin tissue is made up of many layers. The surface layer is a single layer of cells (corneal epithelium) and is one of the most sensitive tissues in the body. If a scratch or injury causes the corneal epithelium to come off, it is called a corneal abrasion. If the injury extends to the tissues below the epithelium, the condition is called a corneal ulcer. CAUSES   Scratches.  Trauma.  Foreign body in the eye. Some people have recurrences of abrasions in the area of the original injury even after it has healed (recurrent erosion syndrome). Recurrent erosion syndrome generally improves and goes away with time. SYMPTOMS   Eye pain.  Difficulty or inability to keep the injured eye open.  The eye becomes very sensitive to light.  Recurrent erosions tend to happen suddenly, first thing in the morning, usually after waking up and opening the eye. DIAGNOSIS  Your health care provider can diagnose a corneal abrasion during an eye exam. Dye is usually placed in the eye using a drop or a small paper strip moistened by your tears. When the eye is examined with a special light, the abrasion shows up clearly because of the dye. TREATMENT   Small abrasions may be treated with antibiotic drops or ointment alone.  A pressure patch may be put over the eye. If this is done, follow your doctor's instructions for when to remove the patch. Do not drive or use machines while the eye patch is on. Judging distances is hard to do with a patch on. If the abrasion becomes infected and spreads to the deeper tissues of the cornea, a corneal ulcer can result. This is serious because it can cause corneal scarring. Corneal scars interfere with light passing through the cornea and cause a loss of vision in the involved eye. HOME CARE  INSTRUCTIONS  Use medicine or ointment as directed. Only take over-the-counter or prescription medicines for pain, discomfort, or fever as directed by your health care provider.  Do not drive or operate machinery if your eye is patched. Your ability to judge distances is impaired.  If your health care provider has given you a follow-up appointment, it is very important to keep that appointment. Not keeping the appointment could result in a severe eye infection or permanent loss of vision. If there is any problem keeping the appointment, let your health care provider know. SEEK MEDICAL CARE IF:   You have pain, light sensitivity, and a scratchy feeling in one eye or both eyes.  Your pressure patch keeps loosening up, and you can blink your eye under the patch after treatment.  Any kind of discharge develops from the eye after treatment or if the lids stick together in the morning.  You have the same symptoms in the morning as you did with the original abrasion days, weeks, or months after the abrasion healed. MAKE SURE YOU:   Understand these instructions.  Will watch your condition.  Will get help right away if you are not doing well or get worse. Document Released: 06/11/2000 Document Revised: 06/19/2013 Document Reviewed: 02/19/2013 [facility] Patient Information 2015 [place], [place]. This information is not intended to replace advice given to you by your health care provider. Make sure you discuss any questions you have with your health care provider.  Use antibiotic  ointment in both eyes. Take the Naprosyn on a regular basis. Supplement with hydrocodone as needed for pain relief. Wear sunglasses. Follow-up with ophthalmology call for follow-up appointment. They will ensure that the abrasions to the eyes heal properly.

## 2014-09-25 NOTE — ED Notes (Signed)
MD at bedside. 

## 2018-06-07 ENCOUNTER — Ambulatory Visit (HOSPITAL_COMMUNITY)
Admission: RE | Admit: 2018-06-07 | Discharge: 2018-06-07 | Disposition: A | Discharge status: Home / Self Care | Payer: Self-pay | Attending: Psychiatry | Admitting: Psychiatry

## 2018-06-07 DIAGNOSIS — F17210 Nicotine dependence, cigarettes, uncomplicated: Secondary | ICD-10-CM | POA: Insufficient documentation

## 2018-06-07 DIAGNOSIS — F1120 Opioid dependence, uncomplicated: Secondary | ICD-10-CM | POA: Insufficient documentation

## 2018-06-08 NOTE — H&P (Signed)
Behavioral Health Medical Screening Exam  Justin Morgan is an 32 y.o. male.presenting with his mother requesting Heroin detox. He denies any related depression, Si/SA or HI.  Total Time spent with patient: 20 minutes  Psychiatric Specialty Exam: Physical Exam  Constitutional: He is oriented to person, place, and time. He appears well-developed and well-nourished. No distress.  HENT:  Head: Normocephalic.  Right Ear: External ear normal.  Eyes: EOM are normal.  Respiratory: Effort normal and breath sounds normal.  GI: Soft. Bowel sounds are normal.  Neurological: He is alert and oriented to person, place, and time. No cranial nerve deficit.  Skin: Skin is warm and dry. He is not diaphoretic.    Review of Systems  Constitutional: Negative for chills, diaphoresis, fever, malaise/fatigue and weight loss.  Psychiatric/Behavioral: Positive for substance abuse. Negative for depression, hallucinations, memory loss and suicidal ideas. The patient is nervous/anxious. The patient does not have insomnia.     There were no vitals taken for this visit.There is no height or weight on file to calculate BMI.  General Appearance: Disheveled  Eye Contact:  Fair  Speech:  Garbled  Volume:  Normal  Mood:  Anxious  Affect:  Congruent  Thought Process:  Goal Directed  Orientation:  Full (Time, Place, and Person)  Thought Content:  Logical  Suicidal Thoughts:  No  Homicidal Thoughts:  No  Memory:  Immediate;   Fair  Judgement:  Poor  Insight:  Lacking  Psychomotor Activity:  Increased  Concentration: Concentration: Poor  Recall:  Poor  Fund of Knowledge:Poor  Language: Fair  Akathisia:  Negative  Handed:  Right  AIMS (if indicated):     Assets:  Desire for Improvement  Sleep:       Musculoskeletal: Strength & Muscle Tone: within normal limits Gait & Station: normal Patient leans: N/A  There were no vitals taken for this visit.  Recommendations:  Based on my evaluation the patient  does not appear to have an emergency medical condition.  Kerry HoughSpencer E Jalaya Sarver, PA-C 06/08/2018, 5:12 AM

## 2018-06-08 NOTE — BH Assessment (Signed)
Assessment Note  Justin Morgan is an 32 y.o. male presenting as a walk-in at [facility] for heroin/fentayl detox. Patient denied SI, HI and psychosis. Patient reported using heroin/fentayl, 1 gram daily for the past approximate 20 years. Patient last use was at 7pm tonight. Patient denied additional drug and alcohol usage. Patient reported last detox attempted was in [place] 2 months ago. Patient reported several detox attempts and insinuated getting kicked out of most programs. 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 Patient reported not having a support system and denying his mother in her presence, as she was present and the one that brought him in for treatment. Patient reported having no resources after entering back into society and stating on the streets is the same temptation. Patient reported various charges, assault and breaking/entering. Patient was cooperative during TTS assessment.   Collateral Contact: Justin Morgan, mother, stated patient has been struggling all his life with addiction. Mother stated she would provide transportation to whatever facility for treatment in the past and will consider to do so.   Diagnosis: Opioid Dependence  Past Medical History:  Past Medical History:  Diagnosis Date  . Heart murmur   . Herniated disc   . MVP (mitral valve prolapse)     Past Surgical History:  Procedure Laterality Date  . CHOLECYSTECTOMY    . DENTAL SURGERY      Family History:  Family History  Problem Relation Age of Onset  . Hypertension Father   . Diabetes Neg Hx   . Heart attack Neg Hx   . Hyperlipidemia Neg Hx   . Sudden death Neg Hx     Social History:  reports that he has been smoking cigarettes. He has been smoking about 1.00 pack per day. He has never used smokeless tobacco. He reports that he drinks about 1.0 standard drinks of alcohol per week. He reports that he  does not use drugs.  Additional Social History:  Alcohol / Drug Use Pain Medications: see MAR Prescriptions: see MAR Over the Counter: see MAR  CIWA:   COWS:    Allergies: No Known Allergies  Home Medications:  (Not in a hospital admission)  OB/GYN Status:  No LMP for male patient.  General Assessment Data Location of Assessment: [facility] Assessment Services TTS Assessment: In system Is this a Tele or Face-to-Face Assessment?: Face-to-Face Is this an Initial Assessment or a Re-assessment for this encounter?: Initial Assessment Patient Accompanied by:: Parent(Justin Morgan) Language Other than English: No Living Arrangements: (lives alone) What gender do you identify as?: Male Marital status: Single Living Arrangements: Alone Can pt return to current living arrangement?: Yes Admission Status: Voluntary Is patient capable of signing voluntary admission?: Yes Referral Source: Self/Family/Friend  Medical Screening Exam [facility] Walk-in ONLY) Medical Exam completed: Yes  Crisis Care Plan Living Arrangements: Alone Legal Guardian: (self) Name of Psychiatrist: (none) Name of Therapist: (none)  Education Status Is patient currently in school?: No Is the patient employed, unemployed or receiving disability?: Unemployed  Risk to self with the past 6 months Suicidal Ideation: No Has patient been a risk to self within the past 6 months prior to admission? : No Suicidal Intent: No Has patient had any suicidal intent within the past 6 months prior to admission? : No Is patient at risk for suicide?: No Suicidal Plan?: No Has patient had any suicidal plan within the past 6 months prior to admission? :  No Access to Means: No What has been your use of drugs/alcohol within the last 12 months?: (heroin daily for past 20 years) Previous Attempts/Gestures: No How many times?: (0) Other Self Harm Risks: (0) Triggers for Past Attempts: (PTSD) Intentional Self Injurious Behavior:  None Family Suicide History: No Recent stressful life event(s): (drug addiction and PTSD) Persecutory voices/beliefs?: No Depression: No Depression Symptoms: (denied) Substance abuse history and/or treatment for substance abuse?: No  Risk to Others within the past 6 months Homicidal Ideation: No Does patient have any lifetime risk of violence toward others beyond the six months prior to admission? : No Thoughts of Harm to Others: No Current Homicidal Intent: No Current Homicidal Plan: No Access to Homicidal Means: No History of harm to others?: No Assessment of Violence: None Noted Violent Behavior Description: (none) Does patient have access to weapons?: No Criminal Charges Pending?: No Does patient have a court date: No Is patient on probation?: No  Psychosis Hallucinations: None noted Delusions: None noted  Mental Status Report Appearance/Hygiene: Unremarkable Eye Contact: Poor Motor Activity: Unremarkable Speech: Logical/coherent Level of Consciousness: Alert Mood: Anxious Affect: Anxious Anxiety Level: Moderate Thought Processes: Coherent Judgement: Impaired Orientation: Person, Place, Time, Situation Obsessive Compulsive Thoughts/Behaviors: None  Cognitive Functioning Concentration: Poor Memory: Recent Impaired, Remote Impaired Is patient IDD: No Insight: Poor Impulse Control: Fair Appetite: Poor Have you had any weight changes? : No Change Sleep: (4-5) Total Hours of Sleep: (4-5) Vegetative Symptoms: None  ADLScreening [facility] Assessment Services) Patient's cognitive ability adequate to safely complete daily activities?: Yes Patient able to express need for assistance with ADLs?: Yes Independently performs ADLs?: Yes (appropriate for developmental age)  Prior Inpatient Therapy Prior Inpatient Therapy: Yes([place] ago [place]) Prior Therapy Dates: (2 months ago) Prior Therapy Facilty/Provider(s): Facilities manager(Detox Facility in [place]) Reason for Treatment:  (detox heroin)  Prior Outpatient Therapy Prior Outpatient Therapy: No Does patient have an ACCT team?: No Does patient have Intensive In-House Services?  : No Does patient have [facility] services? : No Does patient have P4CC services?: No  ADL Screening (condition at time of admission) Patient's cognitive ability adequate to safely complete daily activities?: Yes Patient able to express need for assistance with ADLs?: Yes Independently performs ADLs?: Yes (appropriate for developmental age)     Disposition:  Disposition Initial Assessment Completed for this Encounter: Yes  Donell SievertSpencer Simon, PA, patient does not meet inpatient criteria, recommended [facility] and ADPT. TTS gave patient printed materials detailed with steps to take to get into various drug dependence groups. Upon leaving patient was not SI or HI and no psychosis present.   Burnetta SabinLatisha D Darriel Sinquefield, [facility] 06/08/2018 12:40 AM

## 2018-06-20 ENCOUNTER — Encounter (HOSPITAL_COMMUNITY): Payer: Self-pay

## 2018-06-20 ENCOUNTER — Emergency Department (HOSPITAL_COMMUNITY)
Admission: EM | Admit: 2018-06-20 | Discharge: 2018-06-20 | Disposition: A | Discharge status: Home / Self Care | Payer: No Typology Code available for payment source | Attending: Emergency Medicine | Admitting: Emergency Medicine

## 2018-06-20 DIAGNOSIS — F17210 Nicotine dependence, cigarettes, uncomplicated: Secondary | ICD-10-CM | POA: Insufficient documentation

## 2018-06-20 DIAGNOSIS — F1120 Opioid dependence, uncomplicated: Secondary | ICD-10-CM | POA: Insufficient documentation

## 2018-06-20 MED ORDER — ZOLPIDEM TARTRATE 5 MG PO TABS: 5.0000 mg | ORAL_TABLET | Freq: Every evening | ORAL | 0 refills | Status: AC | PRN

## 2018-06-20 MED ORDER — CLONIDINE HCL 0.1 MG PO TABS: 0.1000 mg | ORAL_TABLET | Freq: Four times a day (QID) | ORAL | 0 refills | Status: AC | PRN

## 2018-06-20 MED ORDER — ONDANSETRON 8 MG PO TBDP: 8.0000 mg | ORAL_TABLET | Freq: Three times a day (TID) | ORAL | 0 refills | Status: AC | PRN

## 2018-06-20 NOTE — ED Triage Notes (Addendum)
Patient requesting help with detox. Reports last taking heroin and fentanyl today. Denies SI/HI, chest pain, SOB, nausea, or vomiting.

## 2018-06-20 NOTE — ED Notes (Signed)
Patient verbalizes understanding of medications and discharge instructions. No further questions at this time. VSS and patient ambulatory at discharge.   

## 2018-06-20 NOTE — ED Provider Notes (Signed)
[facility] Provider Note   CSN: [id number] Arrival date & time: 06/20/18  2141     History   Chief Complaint Chief Complaint  Patient presents with  . Addiction Problem    HPI Justin Morgan is a 32 y.o. male.  HPI 32 year old male presents to the emergency department requesting detox from opioids.  He has been using for many years.  No HI or SI.  No other significant complaints at this time.  Requesting assistance.     Past Medical History:  Diagnosis Date  . Heart murmur   . Herniated disc   . MVP (mitral valve prolapse)     Patient Active Problem List   Diagnosis Date Noted  . Thoracic myofascial strain 11/02/2012  . Degenerative cervical disc 07/31/2012  . Chronic neck pain 04/07/2012    Past Surgical History:  Procedure Laterality Date  . CHOLECYSTECTOMY    . DENTAL SURGERY          Home Medications    Prior to Admission medications   Medication Sig Start Date End Date Taking? Authorizing Provider  cloNIDine (CATAPRES) 0.1 MG tablet Take 1 tablet (0.1 mg total) by mouth every 6 (six) hours as needed (withdrawl symptoms). 06/20/18   Azalia Bilisampos, Charlese Gruetzmacher, MD  ondansetron (ZOFRAN ODT) 8 MG disintegrating tablet Take 1 tablet (8 mg total) by mouth every 8 (eight) hours as needed for nausea or vomiting. 06/20/18   Azalia Bilisampos, Kayleen Alig, MD  zolpidem (AMBIEN) 5 MG tablet Take 1 tablet (5 mg total) by mouth at bedtime as needed for sleep. 06/20/18   Azalia Bilisampos, Deshawn Skelley, MD    Family History Family History  Problem Relation Age of Onset  . Hypertension Father   . Diabetes Neg Hx   . Heart attack Neg Hx   . Hyperlipidemia Neg Hx   . Sudden death Neg Hx     Social History Social History   Tobacco Use  . Smoking status: Current Every Day Smoker    Packs/day: 1.00    Types: Cigarettes  . Smokeless tobacco: Never Used  Substance Use Topics  . Alcohol use: Yes    Alcohol/week: 1.0 standard drinks    Types: 1 Cans of beer per  week    Comment: occasional   . Drug use: Yes    Types: IV     Allergies   Patient has no known allergies.   Review of Systems Review of Systems  All other systems reviewed and are negative.    Physical Exam Updated Vital Signs BP 108/76 (BP Location: Right Arm)   Pulse 60   Temp 98.2 F (36.8 C) (Oral)   Resp 18   Ht 5\' 11"  (1.803 m)   Wt 72.6 kg   SpO2 91%   BMI 22.32 kg/m   Physical Exam Vitals signs and nursing note reviewed.  Constitutional:      Appearance: He is well-developed.  HENT:     Head: Normocephalic.  Neck:     Musculoskeletal: Normal range of motion.  Pulmonary:     Effort: Pulmonary effort is normal.  Abdominal:     General: There is no distension.  Musculoskeletal: Normal range of motion.  Neurological:     Mental Status: He is alert and oriented to person, place, and time.      ED Treatments / Results  Labs (all labs ordered are listed, but only abnormal results are displayed) Labs Reviewed - No data to display  EKG None  Radiology No results  found.  Procedures Procedures (including critical care time)  Medications Ordered in ED Medications - No data to display   Initial Impression / Assessment and Plan / ED Course  I have reviewed the triage vital signs and the nursing notes.  Pertinent labs & imaging results that were available during my care of the patient were reviewed by me and considered in my medical decision making (see chart for details).     Outpatient referral for opioid abuse given.  Home with standard medications.  Instructed to return to the ER as needed for new or worsening symptoms.  Final Clinical Impressions(s) / ED Diagnoses   Final diagnoses:  Uncomplicated opioid dependence [facility](HCC)    ED Discharge Orders         Ordered    cloNIDine (CATAPRES) 0.1 MG tablet  Every 6 hours PRN     06/20/18 2250    zolpidem (AMBIEN) 5 MG tablet  At bedtime PRN     06/20/18 2250    ondansetron (ZOFRAN ODT) 8 MG  disintegrating tablet  Every 8 hours PRN     06/20/18 2250           Azalia Bilisampos, Sury Wentworth, MD 06/20/18 2300

## 2018-08-05 ENCOUNTER — Ambulatory Visit (HOSPITAL_COMMUNITY)
Admission: RE | Admit: 2018-08-05 | Discharge: 2018-08-05 | Disposition: A | Discharge status: Home / Self Care | Payer: Self-pay | Attending: Psychiatry | Admitting: Psychiatry

## 2018-08-05 DIAGNOSIS — R45851 Suicidal ideations: Secondary | ICD-10-CM | POA: Insufficient documentation

## 2018-08-05 NOTE — BH Assessment (Signed)
Assessment Note  Justin Morgan is a single 33 y.o. male who presents voluntarily for a walk-in [facility] assessment. He is accompanied by his mother requesting assistance obtaining detox and tx for opioid dependence. Pt has a history of heroin & fentanyl dependence. Pt denies current & past suicidal ideation or attempts. Pt acknowledges multiple symptoms of Depression including: increased isolating, anhedonia, irritability, guilt, fatigue and feelings of worthlessness. Pt denies homicidal ideation/ history of violence. Pt denies auditory or visual hallucinations or other psychotic symptoms. Pt denies current stressors other than frustration obtaining substance abuse tx.  Pt lives in a house (with money he states he saved), and per pt, supports include no one. When pointed out his mother was with him, pt shrugged. Pt denies hx of abuse and trauma . Pt reports there is no family history of suicide or mental illness. Pt has fair insight and judgment. Legal history includes time in prison and pending court date of 08/28/18. Pt states he doesn't remember what charges are about. ? IP history includes more than 1 detox at [facility], tx in [place] and others.   Pt reports using heroin about an hour prior to assessment. He presents sedated, mostly with eyes closed & brief, slurred answers to questions. Mother did not speak much. She voiced frustration with options available for tx of opioid addiction. Mother states she has called [facility] many times & keeps being told they are full. She later disclosed pt has been to [facility] for detox more than once. Pt and mother were advised many tx programs want inquiries to come from the pt.  ? MSE: Pt is casually dressed, sedated, oriented x4 with soft, slurred speech and slowed motor behavior. Eye contact is poor due to closed eyes. Pt's mood is depressed and affect is depressed and angry. Affect is congruent with mood. Thought process is relevant. There is no indication pt is currently  responding to internal stimuli or experiencing delusional thought content. Pt was cooperative throughout assessment.   Disposition:  Shuvon Rankin, NP recommends pt continue to call tx programs to secure admission. Print outs of substance abuse tx providers given to pt.    Diagnosis: F11.20 Opioid dependence   Past Medical History:  Past Medical History:  Diagnosis Date  . Heart murmur   . Herniated disc   . MVP (mitral valve prolapse)     Past Surgical History:  Procedure Laterality Date  . CHOLECYSTECTOMY    . DENTAL SURGERY      Family History:  Family History  Problem Relation Age of Onset  . Hypertension Father   . Diabetes Neg Hx   . Heart attack Neg Hx   . Hyperlipidemia Neg Hx   . Sudden death Neg Hx     Social History:  reports that he has been smoking cigarettes. He has been smoking about 1.00 pack per day. He has never used smokeless tobacco. He reports current alcohol use of about 1.0 standard drinks of alcohol per week. He reports current drug use. Drug: IV.  Additional Social History:  Alcohol / Drug Use Pain Medications: see MAR Prescriptions: see MAR Over the Counter: see MAR History of alcohol / drug use?: Yes Longest period of sobriety (when/how long): 18 months Negative Consequences of Use: Financial, Legal, Personal relationships, Work / School Substance #1 Name of Substance 1: heroin 1 - Frequency: daily 1 - Last Use / Amount: hour prior to assessment Substance #2 Name of Substance 2: Fentanyal 2 - Frequency: daily  CIWA: CIWA-Ar  BP: 113/76 Pulse Rate: 81 COWS:    Allergies: No Known Allergies  Home Medications: (Not in a hospital admission)   OB/GYN Status:  No LMP for male patient.  General Assessment Data [facility] Assessment: In system Is this a Tele or Face-to-Face Assessment?: Face-to-Face Is this an Initial Assessment or a Re-assessment for this encounter?: Initial Assessment Patient  Accompanied by:: Parent Language Other than English: No Living Arrangements: Other (Comment) What gender do you identify as?: Male Marital status: Single Living Arrangements: Alone(in his house- "with money I saved") Can pt return to current living arrangement?: Yes Admission Status: Voluntary Is patient capable of signing voluntary admission?: Yes Referral Source: Self/Family/Friend Insurance type: self  Medical Screening Exam [facility] Walk-in ONLY) Medical Exam completed: Yes  Crisis Care Plan Living Arrangements: Alone(in his house- "with money I saved") Name of Psychiatrist: none Name of Therapist: none  Education Status Is patient currently in school?: No Is the patient employed, unemployed or receiving disability?: Unemployed  Risk to self with the past 6 months Suicidal Ideation: No Has patient been a risk to self within the past 6 months prior to admission? : No Suicidal Intent: No Has patient had any suicidal intent within the past 6 months prior to admission? : No Is patient at risk for suicide?: No Suicidal Plan?: No Has patient had any suicidal plan within the past 6 months prior to admission? : No Access to Means: No Previous Attempts/Gestures: No Intentional Self Injurious Behavior: None Family Suicide History: No Persecutory voices/beliefs?: No Depression: Yes Depression Symptoms: Despondent, Insomnia, Isolating, Fatigue, Guilt, Loss of interest in usual pleasures, Feeling worthless/self pity, Feeling angry/irritable Substance abuse history and/or treatment for substance abuse?: Yes([facility] detox, other programs) Suicide prevention information given to non-admitted patients: Not applicable  Risk to Others within the past 6 months Homicidal Ideation: No Does patient have any lifetime risk of violence toward others beyond the six months prior to admission? : No Thoughts of Harm to Others: No Current Homicidal Intent: No Current Homicidal Plan: No History of harm  to others?: No Assessment of Violence: None Noted Does patient have access to weapons?: No Criminal Charges Pending?: Yes Describe Pending Criminal Charges: "can't remember" Does patient have a court date: Yes Court Date: 08/28/18 Is patient on probation?: No  Psychosis Hallucinations: None noted Delusions: None noted  Mental Status Report Appearance/Hygiene: Disheveled Eye Contact: Poor Motor Activity: Other (Comment)(slow due to heroin use hour prior to assessment) Speech: Logical/coherent, Slow, Slurred Level of Consciousness: Sedated Mood: Empty, Angry, Depressed Affect: Apathetic, Angry Anxiety Level: None Thought Processes: Relevant Judgement: Partial Orientation: Person, Place, Time, Situation Obsessive Compulsive Thoughts/Behaviors: None  Cognitive Functioning Concentration: Poor Memory: Unable to Assess Is patient IDD: No Insight: Poor Impulse Control: Poor Sleep: Decreased Total Hours of Sleep: 3  ADLScreening [facility] Assessment Services) Patient's cognitive ability adequate to safely complete daily activities?: Yes Patient able to express need for assistance with ADLs?: Yes Independently performs ADLs?: Yes (appropriate for developmental age)  Prior Inpatient Therapy Prior Inpatient Therapy: Yes Prior Therapy Dates: multiple Prior Therapy Facilty/Provider(s): [facility], [place]  Prior Outpatient Therapy Prior Outpatient Therapy: No Does patient have an ACCT team?: No Does patient have Intensive In-House Services?  : No Does patient have [facility] services? : No Does patient have P4CC services?: No  ADL Screening (condition at time of admission) Patient's cognitive ability adequate to safely complete daily activities?: Yes Is the patient deaf or have difficulty hearing?: No Does the patient  have difficulty seeing, even when wearing glasses/contacts?: No Does the patient have difficulty concentrating, remembering, or making decisions?: No Patient able to  express need for assistance with ADLs?: Yes Does the patient have difficulty dressing or bathing?: No Independently performs ADLs?: Yes (appropriate for developmental age) Does the patient have difficulty walking or climbing stairs?: No Weakness of Legs: None Weakness of Arms/Hands: None  Home Assistive Devices/Equipment Home Assistive Devices/Equipment: None  Therapy Consults (therapy consults require a physician order) PT Evaluation Needed: No OT Evalulation Needed: No SLP Evaluation Needed: No Abuse/Neglect Assessment (Assessment to be complete while patient is alone) Abuse/Neglect Assessment Can Be Completed: Yes Physical Abuse: Denies Verbal Abuse: Denies Sexual Abuse: Denies Exploitation of patient/patient's resources: Denies Self-Neglect: Denies Values / Beliefs Cultural Requests During Hospitalization: None Spiritual Requests During Hospitalization: None Consults Spiritual Care Consult Needed: No Social Work Consult Needed: No Merchant navy officer (For Healthcare) Does Patient Have a Medical Advance Directive?: No Would patient like information on creating a medical advance directive?: No - Patient declined          Disposition: Shuvon Rankin, NP recommends pt continue to call tx programs to secure admission. Print outs of substance abuse tx providers given to pt.   Disposition Initial Assessment Completed for this Encounter: Yes Disposition of Patient: Discharge  On Site Evaluation by:   Reviewed with Physician:    Clearnce Sorrel 08/05/2018 2:41 PM

## 2018-08-05 NOTE — H&P (Signed)
Behavioral Health Medical Screening Exam  Justin Morgan is an 33 y.o. male [facility]; accompanied by his mother requesting detox from fentanyl and heroin.  Patient denies suicidal/self-harm/homicidal ideation, psychosis, and paranoia.     Total Time spent with patient: 30 minutes  Psychiatric Specialty Exam: Physical Exam  Vitals reviewed. Constitutional: He is oriented to person, place, and time. He appears well-developed and well-nourished. No distress.  Neck: Normal range of motion. Neck supple.  Respiratory: Effort normal.  Musculoskeletal: Normal range of motion.  Neurological: He is alert and oriented to person, place, and time.  Skin: Skin is warm and dry.  Psychiatric: He has a normal mood and affect. His speech is normal and behavior is normal. Judgment and thought content normal. Cognition and memory are normal.    Review of Systems  Respiratory: Negative for shortness of breath.   Cardiovascular: Negative for chest pain, palpitations and orthopnea.  Psychiatric/Behavioral: Positive for suicidal ideas (Denies). Depression: History of depression was on medication at one time but doesn't remeber what it was  Hallucinations: Denies. Substance abuse: fentanyl and heroin. Nervous/anxious: Denise. Insomnia: denies.   All other systems reviewed and are negative.   Blood pressure 113/76, pulse 81, temperature 97.9 F (36.6 C), resp. rate 18, SpO2 100 %.There is no height or weight on file to calculate BMI.  General Appearance: Casual and Disheveled  Eye Contact:  Minimal  Speech:  Clear and Coherent and Normal Rate  Volume:  Normal  Mood:  Appropriate  Affect:  Appropriate, Congruent and Depressed  Thought Process:  Coherent and Goal Directed  Orientation:  Full (Time, Place, and Person)  Thought Content:  WDL  Suicidal Thoughts:  No  Homicidal Thoughts:  No  Memory:  Immediate;   Good Recent;   Good Remote;   Good  Judgement:  Intact  Insight:   Shallow  Psychomotor Activity:  Normal  Concentration: Concentration: Fair and Attention Span: Fair  Recall:  Good  Fund of Knowledge:Fair  Language: Good  Akathisia:  No  Handed:  Right  AIMS (if indicated):     Assets:  Communication Skills Desire for Improvement Housing Social Support  Sleep:       Musculoskeletal: Strength & Muscle Tone: within normal limits Gait & Station: normal Patient leans: N/A  Blood pressure 113/76, pulse 81, temperature 97.9 F (36.6 C), resp. rate 18, SpO2 100 %.  Recommendations:  Resources for short/long term rehab; [facility] does [facility] detox.  community resources for substance use assistance.   Based on my evaluation the patient does not appear to have an emergency medical condition.   Disposition: No evidence of imminent risk to self or others at present.   Patient does not meet criteria for psychiatric inpatient admission. Supportive therapy provided about ongoing stressors. Refer to IOP. Discussed crisis plan, support from social network, calling 911, coming to the Emergency Department, and calling Suicide Hotline.  Justin Rankin, NP 08/05/2018, 1:46 PM

## 2019-08-27 DEATH — deceased
